# Patient Record
Sex: Female | Born: 1955 | Race: White | Hispanic: No | Smoking: Former smoker
Health system: Southern US, Community
[De-identification: ages and names within clinical notes are randomized; demographics above are authoritative.]

## PROBLEM LIST (undated history)

## (undated) DIAGNOSIS — I871 Compression of vein: Secondary | ICD-10-CM

## (undated) DIAGNOSIS — R0602 Shortness of breath: Secondary | ICD-10-CM

## (undated) DIAGNOSIS — K5090 Crohn's disease, unspecified, without complications: Secondary | ICD-10-CM

## (undated) HISTORY — PX: COLON SURGERY: SHX602

## (undated) HISTORY — PX: ANGIOPLASTY: SHX39

---

## 2001-05-04 ENCOUNTER — Encounter (HOSPITAL_COMMUNITY): Admission: RE | Admit: 2001-05-04 | Discharge: 2001-06-03 | Payer: Self-pay | Admitting: Internal Medicine

## 2001-06-22 ENCOUNTER — Encounter (HOSPITAL_COMMUNITY): Admission: RE | Admit: 2001-06-22 | Discharge: 2001-07-22 | Payer: Self-pay | Admitting: Internal Medicine

## 2001-08-20 ENCOUNTER — Other Ambulatory Visit: Admission: RE | Admit: 2001-08-20 | Discharge: 2001-08-20 | Payer: Self-pay | Admitting: Dermatology

## 2003-02-04 ENCOUNTER — Encounter (HOSPITAL_COMMUNITY): Admission: RE | Admit: 2003-02-04 | Discharge: 2003-03-06 | Payer: Self-pay | Admitting: Internal Medicine

## 2003-04-21 ENCOUNTER — Ambulatory Visit (HOSPITAL_COMMUNITY): Admission: RE | Admit: 2003-04-21 | Discharge: 2003-04-21 | Payer: Self-pay | Admitting: Internal Medicine

## 2003-04-26 ENCOUNTER — Encounter (HOSPITAL_COMMUNITY): Admission: RE | Admit: 2003-04-26 | Discharge: 2003-05-26 | Payer: Self-pay | Admitting: Internal Medicine

## 2003-06-15 ENCOUNTER — Encounter (HOSPITAL_COMMUNITY): Admission: RE | Admit: 2003-06-15 | Discharge: 2003-07-15 | Payer: Self-pay | Admitting: Internal Medicine

## 2003-09-13 ENCOUNTER — Encounter (HOSPITAL_COMMUNITY): Admission: RE | Admit: 2003-09-13 | Discharge: 2003-09-22 | Payer: Self-pay | Admitting: Internal Medicine

## 2003-09-23 ENCOUNTER — Encounter: Payer: Self-pay | Admitting: Internal Medicine

## 2003-09-23 ENCOUNTER — Ambulatory Visit (HOSPITAL_COMMUNITY): Admission: RE | Admit: 2003-09-23 | Discharge: 2003-09-23 | Payer: Self-pay | Admitting: Internal Medicine

## 2004-01-04 ENCOUNTER — Encounter (HOSPITAL_COMMUNITY): Admission: RE | Admit: 2004-01-04 | Discharge: 2004-02-03 | Payer: Self-pay | Admitting: Internal Medicine

## 2004-01-31 ENCOUNTER — Ambulatory Visit (HOSPITAL_COMMUNITY): Admission: RE | Admit: 2004-01-31 | Discharge: 2004-01-31 | Payer: Self-pay | Admitting: Internal Medicine

## 2004-02-11 ENCOUNTER — Ambulatory Visit: Admission: RE | Admit: 2004-02-11 | Discharge: 2004-02-11 | Payer: Self-pay | Admitting: Internal Medicine

## 2004-02-13 ENCOUNTER — Ambulatory Visit (HOSPITAL_COMMUNITY): Admission: RE | Admit: 2004-02-13 | Discharge: 2004-02-13 | Payer: Self-pay | Admitting: Internal Medicine

## 2004-06-28 ENCOUNTER — Ambulatory Visit (HOSPITAL_COMMUNITY): Admission: RE | Admit: 2004-06-28 | Discharge: 2004-06-28 | Payer: Self-pay | Admitting: Internal Medicine

## 2005-02-03 ENCOUNTER — Inpatient Hospital Stay (HOSPITAL_COMMUNITY): Admission: EM | Admit: 2005-02-03 | Discharge: 2005-02-05 | Payer: Self-pay | Admitting: *Deleted

## 2005-04-18 ENCOUNTER — Ambulatory Visit (HOSPITAL_COMMUNITY): Admission: RE | Admit: 2005-04-18 | Discharge: 2005-04-18 | Payer: Self-pay | Admitting: Internal Medicine

## 2006-05-01 ENCOUNTER — Ambulatory Visit (HOSPITAL_COMMUNITY): Admission: RE | Admit: 2006-05-01 | Discharge: 2006-05-01 | Payer: Self-pay | Admitting: Internal Medicine

## 2006-07-07 ENCOUNTER — Ambulatory Visit (HOSPITAL_COMMUNITY): Admission: RE | Admit: 2006-07-07 | Discharge: 2006-07-07 | Payer: Self-pay | Admitting: Internal Medicine

## 2007-03-24 ENCOUNTER — Ambulatory Visit (HOSPITAL_COMMUNITY): Admission: RE | Admit: 2007-03-24 | Discharge: 2007-03-24 | Payer: Self-pay | Admitting: Internal Medicine

## 2007-04-16 ENCOUNTER — Ambulatory Visit: Payer: Self-pay | Admitting: Internal Medicine

## 2007-05-29 ENCOUNTER — Ambulatory Visit: Payer: Self-pay | Admitting: Internal Medicine

## 2007-05-29 ENCOUNTER — Ambulatory Visit (HOSPITAL_COMMUNITY): Admission: RE | Admit: 2007-05-29 | Discharge: 2007-05-29 | Payer: Self-pay | Admitting: Internal Medicine

## 2007-05-29 ENCOUNTER — Encounter (INDEPENDENT_AMBULATORY_CARE_PROVIDER_SITE_OTHER): Payer: Self-pay | Admitting: Internal Medicine

## 2007-07-07 ENCOUNTER — Ambulatory Visit (HOSPITAL_COMMUNITY): Admission: RE | Admit: 2007-07-07 | Discharge: 2007-07-07 | Payer: Self-pay | Admitting: Internal Medicine

## 2007-08-18 ENCOUNTER — Emergency Department (HOSPITAL_COMMUNITY): Admission: EM | Admit: 2007-08-18 | Discharge: 2007-08-18 | Payer: Self-pay | Admitting: Emergency Medicine

## 2007-09-02 ENCOUNTER — Emergency Department (HOSPITAL_COMMUNITY): Admission: EM | Admit: 2007-09-02 | Discharge: 2007-09-02 | Payer: Self-pay | Admitting: Emergency Medicine

## 2007-09-04 ENCOUNTER — Other Ambulatory Visit: Admission: RE | Admit: 2007-09-04 | Discharge: 2007-09-04 | Payer: Self-pay | Admitting: Obstetrics and Gynecology

## 2007-09-04 ENCOUNTER — Ambulatory Visit (HOSPITAL_COMMUNITY): Admission: RE | Admit: 2007-09-04 | Discharge: 2007-09-04 | Payer: Self-pay | Admitting: Obstetrics and Gynecology

## 2007-09-04 ENCOUNTER — Inpatient Hospital Stay (HOSPITAL_COMMUNITY): Admission: AD | Admit: 2007-09-04 | Discharge: 2007-09-08 | Payer: Self-pay | Admitting: General Surgery

## 2007-09-05 ENCOUNTER — Encounter: Payer: Self-pay | Admitting: Diagnostic Radiology

## 2007-09-05 ENCOUNTER — Encounter (INDEPENDENT_AMBULATORY_CARE_PROVIDER_SITE_OTHER): Payer: Self-pay | Admitting: Diagnostic Radiology

## 2007-09-07 ENCOUNTER — Ambulatory Visit: Payer: Self-pay | Admitting: Internal Medicine

## 2007-09-08 ENCOUNTER — Ambulatory Visit: Payer: Self-pay | Admitting: Internal Medicine

## 2007-09-24 ENCOUNTER — Ambulatory Visit (HOSPITAL_COMMUNITY): Admission: RE | Admit: 2007-09-24 | Discharge: 2007-09-24 | Payer: Self-pay | Admitting: Internal Medicine

## 2007-10-01 ENCOUNTER — Ambulatory Visit (HOSPITAL_COMMUNITY): Admission: RE | Admit: 2007-10-01 | Discharge: 2007-10-01 | Payer: Self-pay | Admitting: Surgery

## 2007-10-02 ENCOUNTER — Ambulatory Visit (HOSPITAL_COMMUNITY): Admission: RE | Admit: 2007-10-02 | Discharge: 2007-10-02 | Payer: Self-pay | Admitting: Surgery

## 2007-10-06 ENCOUNTER — Ambulatory Visit (HOSPITAL_COMMUNITY): Admission: RE | Admit: 2007-10-06 | Discharge: 2007-10-06 | Payer: Self-pay | Admitting: Diagnostic Radiology

## 2007-10-31 ENCOUNTER — Inpatient Hospital Stay (HOSPITAL_COMMUNITY): Admission: EM | Admit: 2007-10-31 | Discharge: 2007-11-01 | Payer: Self-pay | Admitting: Emergency Medicine

## 2007-10-31 ENCOUNTER — Ambulatory Visit: Payer: Self-pay | Admitting: Internal Medicine

## 2008-02-13 IMAGING — RF DG BE SINGLE CONTRAST
12 of 24 series · 12 of 24 positions shown · non-contrast
Comparison: none

HISTORY: Crohn's disease, recurrent pelvic abscess status post percutaneous
drainage, question fistula from sigmoid colon

[Series 2: run · 1 of 1 slices shown (1 of 12)]
[im 1/1]
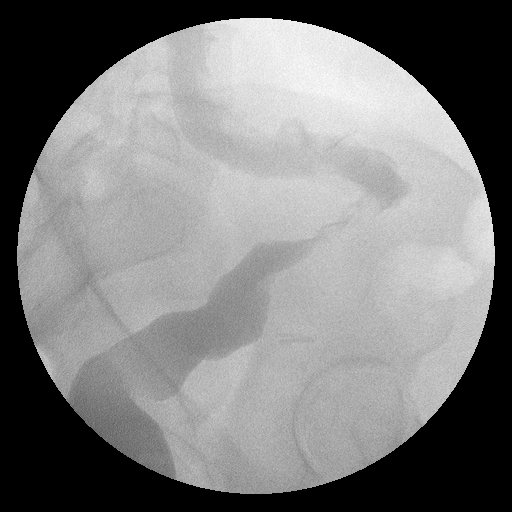

[Series 4: run · 1 of 1 slices shown (2 of 12)]
[im 1/1]
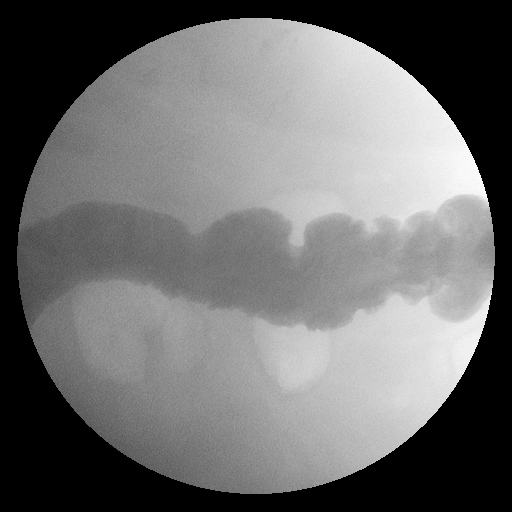

[Series 6: run · 1 of 1 slices shown (3 of 12)]
[im 1/1]
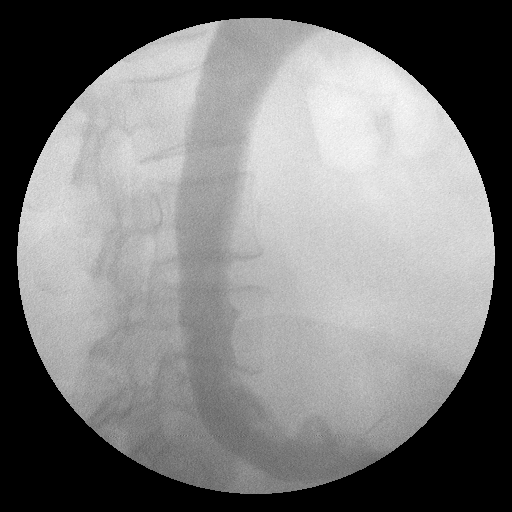

[Series 8: run · 1 of 1 slices shown (4 of 12)]
[im 1/1]
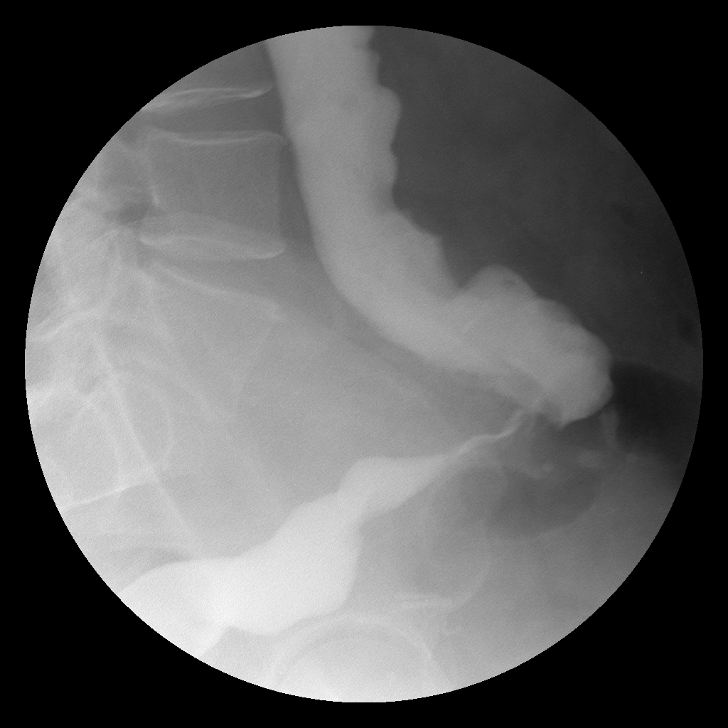

[Series 10: run · 1 of 1 slices shown (5 of 12)]
[im 1/1]
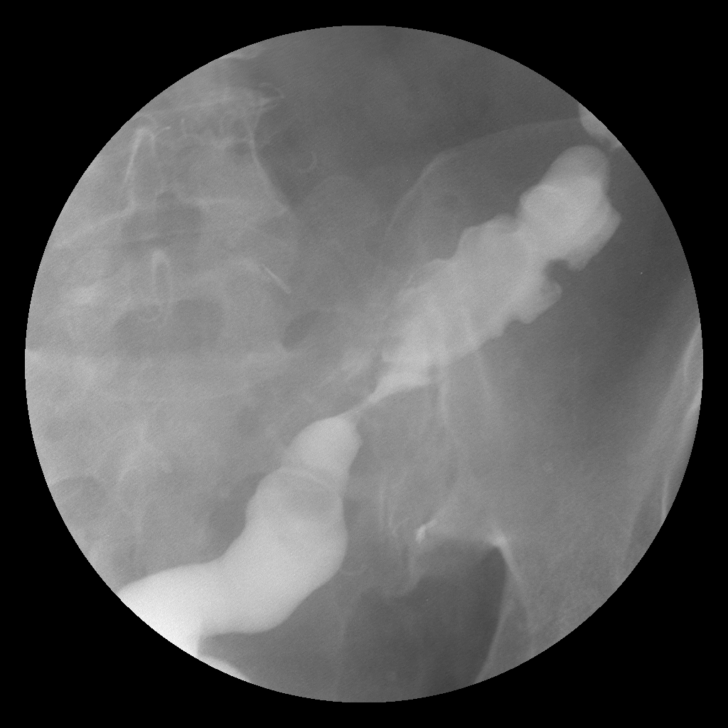

[Series 12: run · 1 of 1 slices shown (6 of 12)]
[im 1/1]
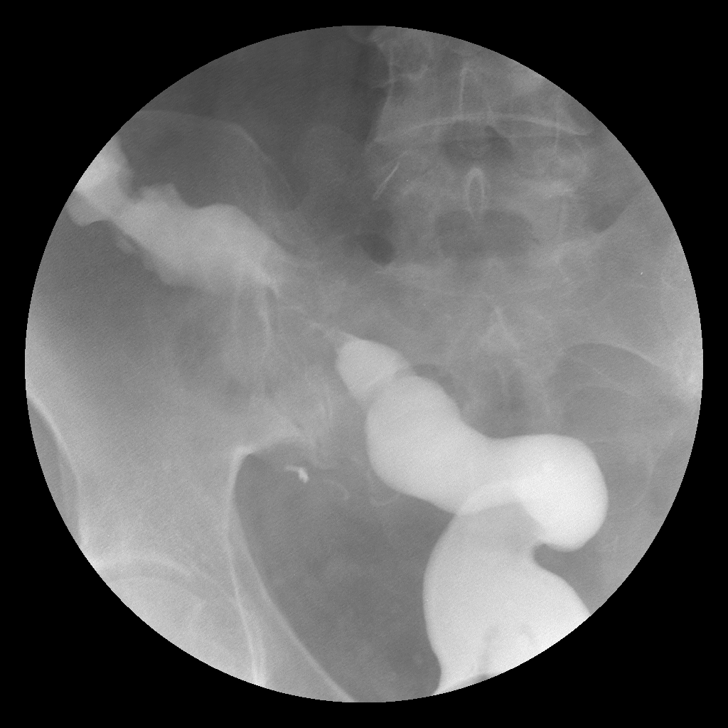

[Series 14: run · 1 of 1 slices shown (7 of 12)]
[im 1/1]
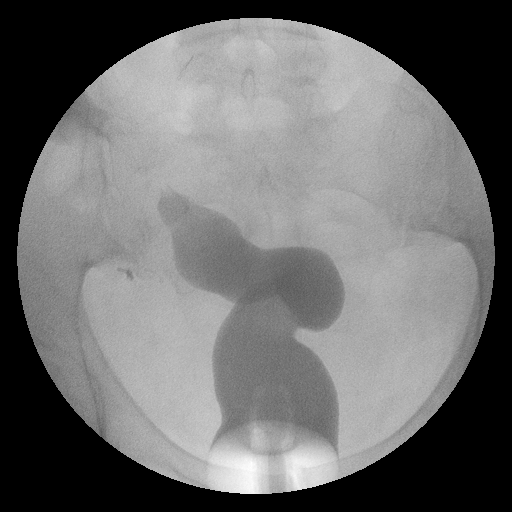

[Series 16: run · 1 of 1 slices shown (8 of 12)]
[im 1/1]
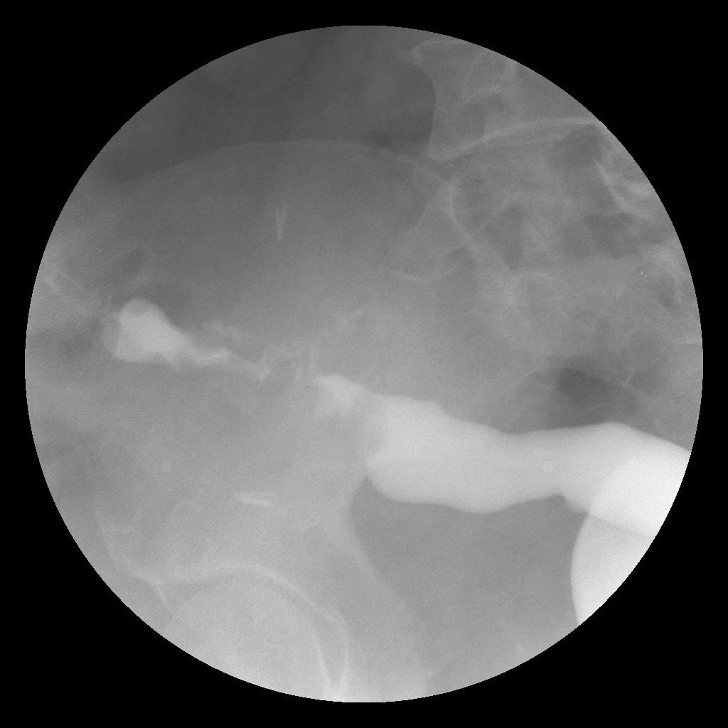

[Series 18: run · 1 of 1 slices shown (9 of 12)]
[im 1/1]
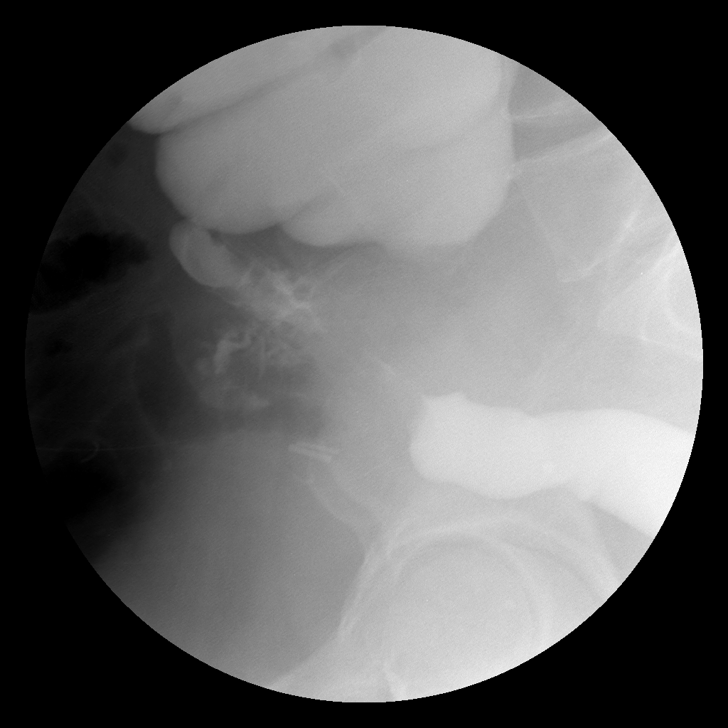

[Series 20: run · 1 of 1 slices shown (10 of 12)]
[im 1/1]
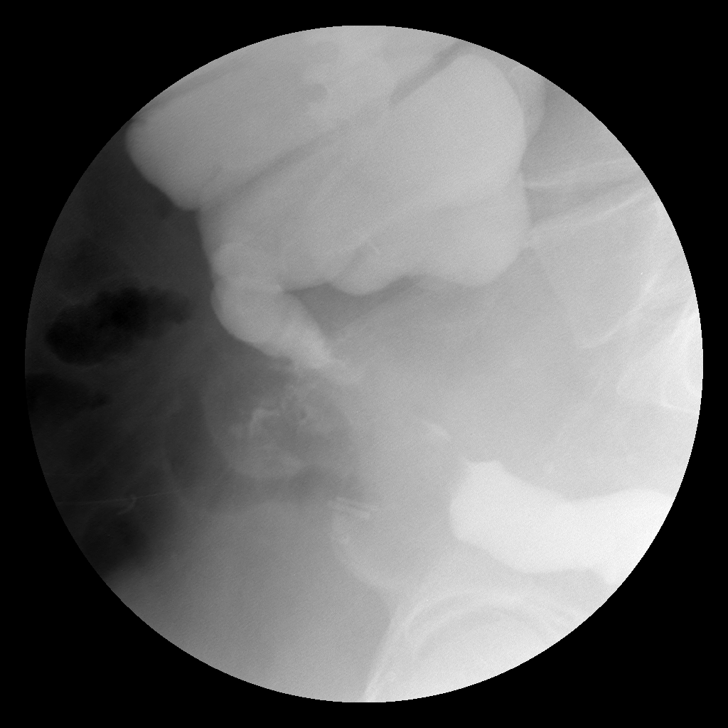

[Series 22: run · 1 of 1 slices shown (11 of 12)]
[im 1/1]
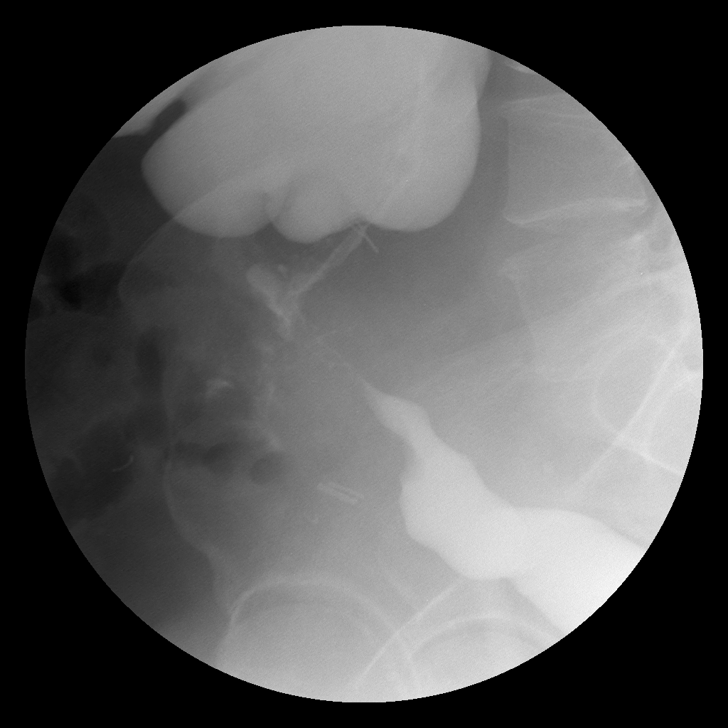

[Series 24: run · 1 of 1 slices shown (12 of 12)]
[im 1/1]
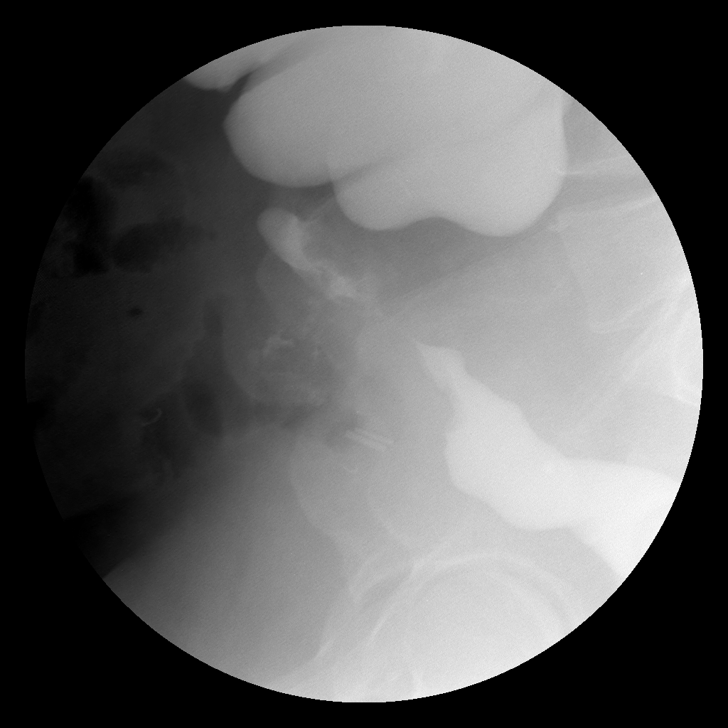

[12 of 24 positions shown; findings below may reference images not displayed]

WATER-SOLUBLE CONTRAST ENEMA:

Normal bowel gas pattern on scout image.
Water soluble contrast enema performed following instillation of dilute
[id number]-[id number].

Rectum and distal sigmoid colon distend normally.
Wall thickening and narrowing of proximal sigmoid colon identified.
During imaging, contrast extravasation from sigmoid colon is seen, thin linear
collections both superior and inferior to the area of wall thickening and
stricture, compatible with fistulae.
No large collection is opacified during imaging.
The contrast within the fistulae appears to dilute and may be dilutional within
the adjacent fluid collection seen on prior CT of 09/24/2007.
The very proximal sigmoid colon and majority of descending colon demonstrate
mucosal irregularity and thickening compatible with involvement by Crohn's
disease.
Very proximal descending colon retrograde to cecal tip demonstrates normal
mucosal fold and haustra patterns.
No additional areas of mass, stricture, wall thickening, or extravasation.
The area of contrast extravasation is visualized multiple times with gravity
pressure.
IMPRESSION: Bowel wall thickening of the descending through mid sigmoid colon most severe at
the mid sigmoid and proximal sigmoid colon where marked area of narrowing and
wall thickening is identified, compatible with given history of Crohn's disease.
Thin linear contrast extravasation is seen at these segments of significant
Crohn's involvement, compatible with fistulae, at the expected position of the
abscess collection seen on preceding CT of to 1885.
No evidence of mass, persistent intraluminal filling defect, or additional
stricture/fistula.
Findings discussed with patient and with Dr. Juliet.

## 2008-05-04 ENCOUNTER — Ambulatory Visit (HOSPITAL_COMMUNITY): Admission: RE | Admit: 2008-05-04 | Discharge: 2008-05-04 | Payer: Self-pay | Admitting: Internal Medicine

## 2009-01-05 ENCOUNTER — Ambulatory Visit (HOSPITAL_COMMUNITY): Admission: RE | Admit: 2009-01-05 | Discharge: 2009-01-05 | Payer: Self-pay | Admitting: Internal Medicine

## 2009-04-07 ENCOUNTER — Ambulatory Visit: Payer: Self-pay | Admitting: Cardiology

## 2010-05-10 ENCOUNTER — Ambulatory Visit: Payer: Self-pay | Admitting: Vascular Surgery

## 2010-05-18 ENCOUNTER — Ambulatory Visit (HOSPITAL_COMMUNITY): Admission: RE | Admit: 2010-05-18 | Discharge: 2010-05-18 | Payer: Self-pay | Admitting: General Surgery

## 2010-06-21 ENCOUNTER — Ambulatory Visit (HOSPITAL_COMMUNITY): Admission: RE | Admit: 2010-06-21 | Discharge: 2010-06-21 | Payer: Self-pay | Admitting: Vascular Surgery

## 2010-06-28 ENCOUNTER — Ambulatory Visit (HOSPITAL_COMMUNITY): Admission: RE | Admit: 2010-06-28 | Discharge: 2010-06-28 | Payer: Self-pay | Admitting: Interventional Radiology

## 2010-11-13 ENCOUNTER — Encounter: Admission: RE | Admit: 2010-11-13 | Discharge: 2010-11-13 | Payer: Self-pay | Admitting: Interventional Radiology

## 2010-12-10 ENCOUNTER — Ambulatory Visit: Payer: Self-pay | Admitting: Internal Medicine

## 2010-12-31 ENCOUNTER — Ambulatory Visit
Admission: RE | Admit: 2010-12-31 | Discharge: 2010-12-31 | Payer: Self-pay | Source: Home / Self Care | Attending: Internal Medicine | Admitting: Internal Medicine

## 2011-01-13 ENCOUNTER — Encounter: Payer: Self-pay | Admitting: Interventional Radiology

## 2011-01-13 ENCOUNTER — Encounter: Payer: Self-pay | Admitting: Internal Medicine

## 2011-01-13 ENCOUNTER — Encounter (INDEPENDENT_AMBULATORY_CARE_PROVIDER_SITE_OTHER): Payer: Self-pay | Admitting: Internal Medicine

## 2011-01-18 ENCOUNTER — Encounter
Admission: RE | Admit: 2011-01-18 | Discharge: 2011-01-18 | Payer: Self-pay | Source: Home / Self Care | Attending: Neurosurgery | Admitting: Neurosurgery

## 2011-01-22 ENCOUNTER — Other Ambulatory Visit: Payer: Self-pay | Admitting: Neurosurgery

## 2011-01-22 DIAGNOSIS — I6200 Nontraumatic subdural hemorrhage, unspecified: Secondary | ICD-10-CM

## 2011-02-01 ENCOUNTER — Ambulatory Visit
Admission: RE | Admit: 2011-02-01 | Discharge: 2011-02-01 | Disposition: A | Discharge status: Home / Self Care | Payer: Self-pay | Source: Ambulatory Visit | Attending: Neurosurgery | Admitting: Neurosurgery

## 2011-02-01 DIAGNOSIS — I6200 Nontraumatic subdural hemorrhage, unspecified: Secondary | ICD-10-CM

## 2011-02-17 ENCOUNTER — Emergency Department (HOSPITAL_COMMUNITY)
Admission: EM | Admit: 2011-02-17 | Discharge: 2011-02-17 | Disposition: A | Discharge status: Home / Self Care | Payer: Medicare Other | Attending: Emergency Medicine | Admitting: Emergency Medicine

## 2011-02-17 DIAGNOSIS — Y849 Medical procedure, unspecified as the cause of abnormal reaction of the patient, or of later complication, without mention of misadventure at the time of the procedure: Secondary | ICD-10-CM | POA: Insufficient documentation

## 2011-02-17 DIAGNOSIS — Z933 Colostomy status: Secondary | ICD-10-CM | POA: Insufficient documentation

## 2011-02-17 DIAGNOSIS — IMO0002 Reserved for concepts with insufficient information to code with codable children: Secondary | ICD-10-CM | POA: Insufficient documentation

## 2011-03-06 ENCOUNTER — Other Ambulatory Visit (HOSPITAL_COMMUNITY): Payer: Self-pay | Admitting: Interventional Radiology

## 2011-03-06 DIAGNOSIS — I871 Compression of vein: Secondary | ICD-10-CM

## 2011-03-10 LAB — BASIC METABOLIC PANEL
CO2: 24 mEq/L (ref 19–32)
Chloride: 110 mEq/L (ref 96–112)
Creatinine, Ser: 1.35 mg/dL — ABNORMAL HIGH (ref 0.4–1.2)
GFR calc non Af Amer: 41 mL/min — ABNORMAL LOW (ref >60)
Glucose, Bld: 100 mg/dL — ABNORMAL HIGH (ref 70–99)
Potassium: 4.3 mEq/L (ref 3.5–5.1)
Sodium: 140 mEq/L (ref 135–145)

## 2011-03-10 LAB — CBC
HCT: 33.2 % — ABNORMAL LOW (ref 36.0–46.0)
RBC: 3.69 MIL/uL — ABNORMAL LOW (ref 3.87–5.11)

## 2011-03-10 LAB — PROTIME-INR
INR: 1.41 (ref 0.00–1.49)
Prothrombin Time: 17.1 seconds — ABNORMAL HIGH (ref 11.6–15.2)

## 2011-03-21 ENCOUNTER — Other Ambulatory Visit (HOSPITAL_COMMUNITY): Payer: Self-pay | Admitting: Interventional Radiology

## 2011-03-21 ENCOUNTER — Other Ambulatory Visit: Payer: Self-pay | Admitting: Interventional Radiology

## 2011-03-21 ENCOUNTER — Ambulatory Visit (HOSPITAL_COMMUNITY)
Admission: RE | Admit: 2011-03-21 | Discharge: 2011-03-21 | Disposition: A | Discharge status: Home / Self Care | Payer: Medicare Other | Source: Ambulatory Visit | Attending: Interventional Radiology | Admitting: Interventional Radiology

## 2011-03-21 DIAGNOSIS — R609 Edema, unspecified: Secondary | ICD-10-CM | POA: Insufficient documentation

## 2011-03-21 DIAGNOSIS — I871 Compression of vein: Secondary | ICD-10-CM

## 2011-03-21 DIAGNOSIS — Z95820 Peripheral vascular angioplasty status with implants and grafts: Secondary | ICD-10-CM

## 2011-03-21 DIAGNOSIS — R51 Headache: Secondary | ICD-10-CM | POA: Insufficient documentation

## 2011-03-21 LAB — BASIC METABOLIC PANEL
BUN: 25 mg/dL — ABNORMAL HIGH (ref 6–23)
Calcium: 9 mg/dL (ref 8.4–10.5)
GFR calc non Af Amer: 49 mL/min — ABNORMAL LOW (ref >60)
Glucose, Bld: 109 mg/dL — ABNORMAL HIGH (ref 70–99)
Sodium: 140 mEq/L (ref 135–145)

## 2011-03-21 LAB — APTT: aPTT: 27 seconds (ref 24–37)

## 2011-03-21 LAB — CBC
Hemoglobin: 11.8 g/dL — ABNORMAL LOW (ref 12.0–15.0)
MCHC: 32.9 g/dL (ref 30.0–36.0)
MCV: 90 fL (ref 78.0–100.0)
Platelets: 255 10*3/uL (ref 150–400)
RDW: 14 % (ref 11.5–15.5)

## 2011-03-21 LAB — PROTIME-INR
INR: 0.93 (ref 0.00–1.49)
Prothrombin Time: 12.7 seconds (ref 11.6–15.2)

## 2011-03-21 MED ORDER — IOHEXOL 300 MG/ML  SOLN
100.0000 mL | Freq: Once | INTRAMUSCULAR | Status: AC | PRN
  Administered 2011-03-21: 50 mL via INTRAVENOUS

## 2011-04-16 ENCOUNTER — Other Ambulatory Visit: Payer: Medicare Other

## 2011-04-30 ENCOUNTER — Other Ambulatory Visit: Payer: Medicare Other

## 2011-05-07 NOTE — Consult Note (Signed)
Kiara Welch, Kiara Welch                 ACCOUNT NO.:  [id number]   MEDICAL RECORD NO.:  [id number]          PATIENT TYPE:  INP   LOCATION:  A309                          FACILITY:  [facility]   PHYSICIAN:  Chelsea Primus, MD      DATE OF BIRTH:  03/10/1956   DATE OF CONSULTATION:  10/31/2007  DATE OF DISCHARGE:                                 CONSULTATION   REASON FOR CONSULTATION:  Abdominal pain.   HISTORY OF PRESENT ILLNESS:  The patient is a 55 year old female with a  history of Crohn's disease, arthritis, and previously diagnosed intra-  abdominal abscess associated with her Crohn's disease.  She currently  presents with a 24-hour history of worsening abdominal pain, nausea,  vomiting which started acutely.  She describes her pain as constant  within the lower abdomen.  Denies any radiation.  She describes it as  cramping in nature.  She has had similar episodes of this in the past  associated with her Crohn's disease.  She denies any current fever or  chills.  She had a previous interventional radiology placed a pigtail  catheter into the intra-abdominal abscess.  She states that her drainage  has continued.  It has not changed significantly over the last several  days, although she states she did not empty her pouch within the last 24  hours.  She has had continued bowel function and her last bowel movement  was noted to be watery in nature.   PAST MEDICAL HISTORY:  Positive for:  1. Crohn's.  2. Osteoarthritis.  3. Prolapse uterus.   PAST SURGICAL HISTORY:  1. Oophorectomy.  2. Colon resection.  3. Interventional radiology drainage of intra-abdominal abscess.   CURRENT MEDICATIONS:  None.   ALLERGIES:  No known drug allergies.   SOCIAL HISTORY:  She is a positive tobacco smoker.  No alcohol use.  No  recreational drug use.   PHYSICAL EXAMINATION:  VITAL SIGNS:  98.7, heart rate 82, respiratory  rate 16, BP 112/69.  GENERAL:  Currently she is not in any acute distress.  She  is alert and  oriented.  She had a recently placed NG tube in place which was draining  gastric secretions/gastric contents.  No frank bile is noted.  HEENT:  Pupils are equal, round, and reactive.  Extraocular movements  intact.  No conjunctival pallor or scleral icterus is noted.  PULMONARY:  Regular respirations.  She is clear to auscultation.  HEART:  Rate is regular rate and rhythm.  ABDOMEN:  She does have positive bowel sounds.  Her abdomen is soft.  Mildly distended.  She has a mild amount of tenderness, particularly in  the infraumbilical region.  No masses.  No hernias.  No peritoneal signs  are apparent.  Drain site shows no erythema or drainage around the  drain.  The dressing is dry.  Percutaneous drainage is purulent in  appearance.  EXTREMITIES:  Warm and dry.   PERTINENT LABORATORY AND RADIOGRAPHIC EVALUATION:  CBC:  White blood  cell count of 7.9, hemoglobin 13.4, hematocrit 30.4, platelets 378.  Chem-7:  Sodium 142,  potassium 4, chloride 104, bicarb 24, BUN 8,  creatinine 0.7, glucose 118.  CT evaluation of the abdomen and pelvis  demonstrated no free air.  There is a pigtail catheter is present.  There are several loops of  surrounding the small bowel.  There is some  thickening of the surrounding tissue, consistent with acute  inflammation.  No significant intra-abdominal abscess is apparent.   ASSESSMENT/PLAN:  Crohn's disease.  At this point I suspect exacerbation  of her Crohn's disease.  She has not been on any medications.  She is to  start Remicade per her primary gastroenterologist who does not come to  [facility].  At this point, she has not been on any previous  treatment for her Crohn's disease.   Based on her symptoms, I recommend:  1. Continued bowel rest.  2. I agree with the NG compression  3. Continued IV fluid hydration.  4. I additionally, recommend at this point continuing empiric      antibiotic coverage secondarily to her previously  placed      percutaneous drain for her previously diagnosed fistula, although      this may be contributing.  Continued conservative management is      indicated at this point.  No acute surgical intervention apparent      and would recommend continued conservative management of the      fistula with hopes to avoid operation in a patient with known      Crohn's which was discussed at length with the patient and      patient's family.  5. We will continue those directions as planned.  6. We will continue to follow the patient while she hospitalized at      [facility].  We will monitor her progression.   I appreciate the opportunity to participate in her care.      Chelsea Primus, MD  Electronically Signed     BZ/MEDQ  D:  10/31/2007  T:  11/01/2007  Job:  [id number]   cc:   Estill Bamberg. Karie Kirks, M.D.  Fax: [phone]

## 2011-05-07 NOTE — Discharge Summary (Signed)
NAME:  Kiara Welch, Kiara Welch NO.:  [id number]   MEDICAL RECORD NO.:  [id number]          PATIENT TYPE:  INP   LOCATION:  P947                          FACILITY:  [facility]   PHYSICIAN:  Jamesetta So, M.D.  DATE OF BIRTH:  1956-08-29   DATE OF ADMISSION:  09/04/2007  DATE OF DISCHARGE:  09/16/2008LH                               Bertie COURSE SUMMARY:  The patient is a 55 year old white female with  known history of Crohn's disease, who presented with a fluid collection  around the sigmoid colon.  A white count was also noted be elevated to  19,000.  The following day, she underwent a CT-guided percutaneous  drainage of the fluid collection.  Interestingly, the fluid was  relatively clear, though final cultures revealed an E-coli which was  resistant to ciprofloxacin.  The patient's white blood cell count  started to normalize, once her steroid dose was decreased.  Cell  cytology on the fluid is still pending.   The drainage on the day of discharge had significantly decreased, thus  the pigtail drain was removed.  The patient is being discharged home on  September 08, 2007 in good improving condition.   DISCHARGE INSTRUCTIONS:  The patient is to follow up with Dr. Laural Golden, as  previously as scheduled.   DISCHARGE MEDICATIONS:  Include:  1. Keflex 500 mg p.o. t.i.d. times 1 week.  2. Flagyl 200 mg p.o. t.i.d. times 1 week.  3. Percocet as previously prescribed.  4. Prednisone 30 mg p.o. daily   PRINCIPAL DIAGNOSES:  1. Intra-abdominal abscess/fluid collection.  2. History of Crohn's disease.   PRINCIPAL PROCEDURE:  A CT-guided percutaneous drainage of intra-  abdominal fluid collection on September 05, 2007.      Jamesetta So, M.D.  Electronically Signed     MAJ/MEDQ  D:  09/08/2007  T:  09/08/2007  Job:  [id number]   cc:   Jamesetta So, M.D.  Fax: [phone]   Patient Chart   Hildred Laser, M.D.  Fax: [phone]

## 2011-05-07 NOTE — H&P (Signed)
NAME:  Kiara Welch, Kiara Welch                 ACCOUNT NO.:  [id number]   MEDICAL RECORD NO.:  [id number]          PATIENT TYPE:  INP   LOCATION:  A331                          FACILITY:  [facility]   PHYSICIAN:  Jamesetta So, M.D.  DATE OF BIRTH:  10-20-1956   DATE OF ADMISSION:  09/05/2007  DATE OF DISCHARGE:  LH                              HISTORY & PHYSICAL   AGE:  55 years old.   CHIEF COMPLAINT:  Intraabdominal abscess.   HISTORY OF PRESENT ILLNESS:  Patient is a 55 year old white female with  a history of Crohn disease who underwent an ultrasound of her pelvis by  Dr. Mallory Shirk for pelvic pain and was found to have a large fluid  collection.  This was felt to be an abscess in nature.  She is being  admitted to [facility] for further workup and treatment.   PAST MEDICAL HISTORY:  Includes Crohn disease.   PAST SURGICAL HISTORY:  Partial bowel resection, incisional  herniorrhaphy.   CURRENT MEDICATIONS:  Prednisone 10 mg p.o. daily.   ALLERGIES:  No known drug allergies.   REVIEW OF SYSTEMS:  Patient does smoke.  She denies any alcohol use.  She denies any other cardiopulmonary difficulties or bleeding disorders.   PHYSICAL EXAMINATION:  Patient is a well-developed, well-nourished white  female in no acute distress.  LUNGS:  Clear to auscultation with equal breath sounds bilaterally.  HEART EXAMINATION:  Reveals a regular rate and rhythm without S3, S4, or  murmurs.  ABDOMEN:  Soft with fullness and tenderness in the suprapubic and left  lower quadrant regions.  No hepatosplenomegaly, masses, or hernias  identified.  RECTAL EXAMINATION:  Revealed fullness in the rectum.   White blood cell count 19.8, hematocrit 36, platelet count 442, MET7 is  remarkable for a potassium of 3.4.  BUN and creatinine are within normal  limits.  Lipase is within normal limits.   CT scan of the abdomen and pelvis reveals a large ascending pelvic  abscess consistent with a possible  perforated diverticulitis.   IMPRESSION:  Intraabdominal abscess, question flare up of Crohn disease  versus diverticulitis.   PLAN:  The patient will be admitted to hospital for Ciprofloxacin and  Flagyl IV.  She subsequently is being transported to [facility] for  interventional radiology.  She will undergo a percutaneous CT guided  drainage of the abscess.  The risks and benefits of the procedure were  fully explained to the patient, who gives informed consent.      Jamesetta So, M.D.  Electronically Signed     MAJ/MEDQ  D:  09/05/2007  T:  09/05/2007  Job:  [id number]   cc:   Hildred Laser, M.D.  Fax: [phone]   Tesfaye D. Legrand Rams, MD  Fax: [phone]   Jonnie Kind, M.D.  Fax: [phone]

## 2011-05-07 NOTE — Group Therapy Note (Signed)
NAMEGICELA, SCHWARTING                 ACCOUNT NO.:  [id number]   MEDICAL RECORD NO.:  [id number]          PATIENT TYPE:  INP   LOCATION:  A309                          FACILITY:  [facility]   PHYSICIAN:  Estill Bamberg. Karie Kirks, M.D.DATE OF BIRTH:  01/11/1956   DATE OF PROCEDURE:  DATE OF DISCHARGE:  11/01/2007                                 PROGRESS NOTE   SUBJECTIVE:  She is feeling more distended today.   OBJECTIVE:  VITAL SIGNS:  Temperature temperature is 98.6, pulse 76,  respiratory rate 20, blood pressure 137/81.  GENERAL:  She is sitting in bed.  She is well-developed, well-nourished,  in distress due to the abdominal distention and lower abdominal pain.  She appears oriented, alert.  LUNGS:  The lungs are clear throughout moving air well, no intercostal  retractions, no use of accessory muscle respiration.  HEART:  Irregular  rhythm, rate of 80.  ABDOMEN:  The abdomen is distended and slightly tender in the lower  abdomen.  There is trace edema of the ankles.   White cell count today is 7,900 with 64 neutrophils, 24 lymphs, H&H  13.4/40.4.  White cell count is down from 14,900 last night, 88  neutrophils, 9 lymphs.   ASSESSMENT:  1. Crohn disease.  2. Fistula.   PLAN:  Continue the Rocephin 1 gram IV q.24 h.  Add metronidazole 500 mg  IV q.8 h.  Consult with GI and surgery.  Will discuss with Dr. Laural Golden,  who has been her gastroenterologist in the past.  Discussed all of this  with the patient and her mom, who is in the room with her.      Estill Bamberg. Karie Kirks, M.D.  Electronically Signed     SDK/MEDQ  D:  10/31/2007  T:  11/01/2007  Job:  [id number]

## 2011-05-07 NOTE — Consult Note (Signed)
Kiara Welch, Kiara Welch                 ACCOUNT NO.:  [id number]   MEDICAL RECORD NO.:  [id number]          PATIENT TYPE:  INP   LOCATION:  A309                          FACILITY:  [facility]   PHYSICIAN:  R. Garfield Cornea, M.D. DATE OF BIRTH:  10-Aug-1956   DATE OF CONSULTATION:  10/31/2007  DATE OF DISCHARGE:                                 CONSULTATION   GASTROENTEROLOGY CONSULTATION   REASON FOR CONSULTATION:  Abdominal pain, nausea and vomiting in patient  with Crohn's colitis.   REFERRING PHYSICIAN:  Estill Bamberg. Karie Kirks, M.D.   HISTORY OF PRESENT ILLNESS:  Ms. Kiara Welch is a pleasant 55-year-  old Caucasian female [place] [employer] with a good 25 year history  of Crohn's colitis admitted through the emergency department on October 30, 2007 with worsening abdominal pain, nausea and vomiting.  She was  admitted to Dr. Vickey Sages service.   She was recently admitted here in September with a large dumb-bell  shaped intra-abdominal abscess requiring percutaneous drainage under the  care of Dr. Arnoldo Morale.  I saw her in consultation at that time.  Ultimately, the abscess fluid grew out Escherichia coli.  The drain was  pulled out in approximately 4 days and she saw Dr. Laural Golden (her primary  gastroenterologist) in followup in the latter part of September, as I  understand and there was concern that she may end up needing surgery for  fistulizing Crohn's disease.  There was some discussion about Remicade  at her followup visit as I understand and arrangements were made for her  to see Dr. Neldon Mc, general surgeon down in [place], which  she did.  She had recurrent abdominal pain and underwent a repeat CT  scan the early part of February which revealed a recurrent abscess for  which percutaneous drainage was again employed and since that time she  remains with a JP drain.  This drain has put out 20 to 30 cc of milky  [place]iz[place], malodorous fluid since it was placed.  She did  undergo a  Gastrografin enema down in [place] which suggested a fistula tract.   In the interim since last being admitted here, she saw Dr. Mallory Shirk  in regards to concerns about a prolapse of her uterus.  She tells me Dr.  Glo Herring told her that her uterus was in a fixed position, likely  related to adhesions related to her Crohn's disease.   A CT scan today reveals a significant partial small bowel obstruction  and phlegmonous changes / chronic inflammatory changes about the small  bowel and sigmoid colon in close approximation to the dome of the  uterus, just where the JP drain is located.  There was, however, no  fluid collection, but again, there was a notable partial small bowel  obstruction.   An NG tube was placed this afternoon at 13:00 and immediately recovered  400 cc of gastric contents and now there is a good liter of fluid in the  Vacutainer and Kiara Welch's abdominal pain and nausea have lessened  considerably.   Admission laboratory data includes a  white count of 7.9, H and H 13.4  and 40.4.  She has been started on antibiotics in the way of Rocephin  and Flagyl.   Kiara Welch tells me that her bowel movements have become smaller and  infrequent over the past two to three days.  In addition, where the JP  drain was sutured in, has broken down and the skin is irritated and she  has noted green drainage from around the [place] site.  She has not had any  fever or chills.  She has not been on any outpatient medicines for  Crohn's although she took Keflex for approximately two weeks after the  second JP was placed.  She previous to that had been on prednisone as an  outpatient and the corticosteroid therapy was continued with Solu-Medrol  through her hospitalization here in September and she was discharged on  prednisone, but subsequently she has stopped taking this agent.   PAST MEDICAL HISTORY:  1. Significant as relates to Crohn's colitis.  She has had this a good       25+ years.  She has had multiple surgeries for perianal fistula,      exploratory laparotomy, drainage of multiple intra-abdominal      abscesses.  She has been seen by Dr. Kathyrn Lass and had surgery at      [facility] previously.  2. She is status post left salpingo-oophorectomy for benign tumor.  3. Status post appendectomy.  4. She had surgery, lastly, in March, 2000 at [facility] for recurrent intra-abdominal abscess.  She has a history      of Port-A-Cath placement.  She received Remicade three times back      in 2000 and reportedly developed an abscess subsequent to biologic      therapy.  5. Apparently she has also undergone a right salpingo-oophorectomy      previously for benign tumor.  6. She is status post partial sigmoid colectomy in the past as well.      A colonoscopy was last done in May 29, 2007 by Dr. Laural Golden.  He      found active disease in her sigmoid and descending colon.  She had      a single diverticulum in her right colon.  7. She has had various INTOLERANCES to all MESALAMINE preparations      previously, felt her throat was closing off from [facility]      previously.  She told she generally felt terrible with a course of      6-MP previously.  She has tolerated antibiotics and corticosteroid      therapies in the past.   MEDICATIONS ON ADMISSION:  None.   CURRENT MEDICATIONS:  1. Rocephin.  2. Intravenous Flagyl.   ALLERGIES:  No known drug allergies, although INTOLERANCES to CROHN'S  AGENTS AS DESCRIBED ABOVE.   FAMILY HISTORY:  Negative for chronic GI or liver illness.   SOCIAL HISTORY:  The patient is married, has four children, one step-  child.  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  She lives here in  [place].  She smokes one pack of cigarettes about every three days.  No alcohol or illicit drugs.   REVIEW OF SYSTEMS:  Has not had  a recent fever.  She denies weight loss.  There has been no hematochezia or melena.  No  hematemesis.  She really  has not had any reflux symptoms, odynophagia or dysphagia.  No unusual  skin, eye or joint problems.  No chest pain, dyspnea and no fever or  chills.   PHYSICAL EXAMINATION:  GENERAL APPEARANCE:  Reveals a pleasant 62-year-  old lady who is alert and conversant but appears not to feel well.  VITAL SIGNS:  Temperature 98.1.  Pulse 84.  Respiratory rate 16.  Blood  pressure 111/75.  SKIN:  Warm and dry.  She has an NG tube in place draining dark bilious-  colored fluid.  HEENT:  No scleral icterus.  Conjunctivae are pink.  CHEST:  Lungs are clear to auscultation.  CARDIAC:  Heart has a regular rate and rhythm without murmurs, rubs or  gallops.  ABDOMEN:  Full.  She has multiple well-healed surgical scars.  Bowel  sounds slightly infrequent and high-pitched.  She has a JP drain in  place.  She has mild diffuse tenderness to palpation, more pronounced in  both lower quadrants.  No obvious mass or organomegaly.  EXTREMITIES:  No edema.   LABORATORY DATA:  CBC as stated above.  Sodium 142, potassium 4.0,  chloride 104, CO2 29, glucose 118.  BUN 8, creatinine 0.74, bilirubin  0.4, alkaline phosphatase 50, AST 15, ALT 14, albumin 3.1, calcium 9.2.   IMPRESSION:  Ms. Suvi Archuletta. Welch is a very pleasant 55 year old lady with  multi-decade history of Crohn's colitis admitted to the hospital with  acute on chronic abdominal pain, nausea and vomiting.   The CT scan from day, which I personally reviewed with Dr. Earle Gell,  indicates a notable partial small bowel obstruction and marked chronic  inflammatory changes in the pelvis in the area of the small bowel, dome  of the uterus and left colon.  JP drain seems to be in a good position  and there is no free fluid or free air.   This lady has symptomatically improved considerably since having NG tube  placed.   She presented with a  large abscess in September, which was drained  percutaneously.  The abscess grew out Escherichia coli.  She has had  recurrence (or more likely persistence) of intra-abdominal infection  with radiographic evidence of recurrent abscess requiring repeat  drainage, approximately one month ago, down in [place].  That fluid  also grew out Escherichia coli.  Gastrografin enema at that time  suggested fistula.   This lady's JP continues to drain milky, [place], malodorous fluid and has  not really nadired.  It is putting out a good 20 to 30 cc daily and now  the skin is draining around the JP site.  I am most concerned that she  basically has a hole in her colon somewhere and the JP drain at this  time is simply functioning as a fistula.  This has been an ongoing  process for a month and I doubt it is going to improve as is.   Moreover, this lady is now presenting with partial small bowel  obstruction, which I suspect is related to phlegmonous process in her  abdomen related to the recent abscess.  There may be significant  involvement in this phlegmonous process with her uterus as well.   RECOMMENDATIONS:  1. In my mind, this lady is not a candidate for any biologic therapy      as she has been recently on prednisone and clinically has ongoing      infection.  I suspect she will need surgical  intervention to      definitively deal with her small bowel obstruction and the ongoing      inflammatory process.  2. Kiara Welch has related to me she would like to get her uterus out if      she has surgery, and this may not at all be unreasonable as the      uterus may be quite involved in this process.  3. I suspect surgical intervention will be a challenge and may be best      contemplated and carried out at a tertiary referral center.  I      would give some consideration to sending her back to Dr. Kathyrn Lass,      who has performed surgery on her in the past over at [facility].  4. I agree with culture of the discharge drainage around the JP tube,      but I have also requested nursing staff to send some of the [place],      milky discharge COMING OUT of her JP drain for Gram stain and      culture as well.  5. I will be discussing the case with Dr. Karie Kirks in the very  near      future.   I would like to thank Dr. Karie Kirks for allowing me to see this nice lady  once again with a very challenging problem.      Bridgette Habermann, M.D.  Electronically Signed     RMR/MEDQ  D:  10/31/2007  T:  11/01/2007  Job:  [id number]   cc:   Hildred Laser, M.D.  Fax: [phone]   Estill Bamberg. Karie Kirks, M.D.  Fax: [phone]

## 2011-05-07 NOTE — Group Therapy Note (Signed)
NAMEAMARII, Kiara Welch                 ACCOUNT NO.:  [id number]   MEDICAL RECORD NO.:  [id number]          PATIENT TYPE:  INP   LOCATION:  A309                          FACILITY:  [facility]   PHYSICIAN:  Estill Bamberg. Karie Kirks, M.D.DATE OF BIRTH:  1956-09-06   DATE OF PROCEDURE:  11/01/2007  DATE OF DISCHARGE:  11/01/2007                                 PROGRESS NOTE   SUBJECTIVE:  The patient feels much better now that she has an NG tube  in.  There is occasional crampy pain.  She reminds me that around 2000-  2001, she has three separate surgeries at [facility] by Dr.  Kathyrn Lass.   OBJECTIVE:  VITAL SIGNS:  Temperature 98.7, pulse 92 respiratory rate  20, blood pressure 117/71.  GENERAL:  She is semirecumbent in bed, oriented, alert, well developed,  well nourished, in NAD.  HEENT:  She had an NG tube to suction.  HEART:  Has a regular rhythm, rate about 80.  LUNGS:  Clear throughout.  ABDOMEN:  Soft.  There is really no organomegaly or mass.  The  distention from yesterday has cleared.  EXTREMITIES:  There is no edema of the ankles.   ASSESSMENT:  1. Crohn's disease.  2. Ileus.  3. Chronically draining fistula.   PLAN:  Discussed with Dr. Gala Romney, gastroenterologist, last night.  He  recommended she be transferred to [facility] either today or tomorrow, and  will need definitive surgery for the draining sinus.  Discussed all this  with her.      Estill Bamberg. Karie Kirks, M.D.  Electronically Signed     SDK/MEDQ  D:  11/01/2007  T:  11/02/2007  Job:  [id number]

## 2011-05-07 NOTE — H&P (Signed)
Kiara Welch, Kiara Welch                 ACCOUNT NO.:  [id number]   MEDICAL RECORD NO.:  [id number]          PATIENT TYPE:  INP   LOCATION:  A309                          FACILITY:  [facility]   PHYSICIAN:  Estill Bamberg. Karie Kirks, M.D.DATE OF BIRTH:  09/24/1956   DATE OF ADMISSION:  10/30/2007  DATE OF DISCHARGE:  LH                              HISTORY & PHYSICAL   A 55 year old woman presented to emergency room with abdominal pain and  abdominal distention.   She has a long history of Crohn's disease going back at least 30 years.  She had a major flare at year 2000 at which time she had to have a bowel  resection.  She tells me that she is unable to tolerate pretty  everything that has been used for Crohn's including methotrexate.  Her  gastroenterologist, Dr. Laural Golden, has considered using is subcutaneous  methotrexate but this has not been used yet.   Further she has developed a fistula the last few months.  This was  originally diagnosed by  Glo Herring which she had pelvic pain.  She had a  fluid collection which was drained by interventional radiology.  Apparently this was a negative culture initially but then grew out E.  Coli later.   She is on no medication.  No allergies are noted.  She has been a  smoker.   Apparently this summer she had a prolapsed uterus and she manually put  this back in place and then developed the pain as mentioned above with a  workup admission initially by Dr. first and later by Dr. Aviva Signs of  surgery.   Her bowel surgery was done I think in the year 2002. It was done at  [facility].   There is also a very strong family history.  Her first cousin has  Crohn's disease.  She has a child who has irritable bowel disease  and a  sibling who has ulcerative colitis.  Further, an uncle had Crohn's  disease.   Apparently this family member from [place] and numerous members the  family had GI problems of various types including Crohn's disease.   EXAM:   Emergency room showed a pleasant middle-aged woman whose  temperature is 98.5, blood pressure 109/79, pulse 93, respiratory rate  20.  She is oriented and alert.  No acute distress but concerned of  course over the abdominal distention, lower abdominal pain.  This has  been getting significant worse over the last couple of days.  There is  no axillary significant adenopathy.  There is no anterior cervical  adenopathy.  Mucous membranes appeared to be moist, skin turgor normal.  The heart had a regular rhythm, rate of about 80, lungs appear clear  throughout moving air well without intercostal retraction, without use  accessory muscles of respiration.  The abdomen was mildly tender in the  lower abdomen.  Drain tube was noted a left lower quadrant.  Distention  is noted in the upper abdomen.  There is no edema of the ankles.   O2 sats were 95% and 97% on room air.  Pain was 7 on a scale of 10 after  treatment and 10 scale 10.4 before IV Dilaudid.   Her admission white cell count is 14,900 of which 88% were neutrophils,  nine lymphs.  H&H is 13.0, 39.4, MCV of 85, platelet count 363,000.  CMP  showed a glucose of 118, albumin 3.1.  UA stick was negative.  Blood  cultures obtained.   A CT of the abdomen and pelvis showed mild dilatation of small bowel  loops and lower abdomen and the right colon to be fluid-filled.  There  is some mild edema within the extraperitoneal tissues of the pelvic  floor and small bowel adhesions in the deep fascia of the recti sheath.  She has a small amount of free flow in the anterior and mid and right  abdomen, trace to mild extent in anterior bowel loops.  Pigtail catheter  was stable in position.  Evidence of prior abscess was noted, but there  was apparently no residual drainage of fluid within the central  peritoneal cavity.  Radiologist felt there was a degree of at least  partial small bowel obstruction related to the  inflammatory change in  the  central anatomic pelvis.   ADMISSION DIAGNOSIS:  1. Crohn's disease.  2. Small bowel obstruction.  3. Draining fistula  4. Strong family history for ulcerative  colitis and regional      enteritis.   I discussed this with the patient.  Admit to hospital, put on IV fluids,  put on IV antibiotics and if in the morning she is improved discharge  home and if there is no improvement will get GI and surgery to see her  and further workup is needed.      Estill Bamberg. Karie Kirks, M.D.  Electronically Signed     SDK/MEDQ  D:  10/31/2007  T:  11/01/2007  Job:  [id number]

## 2011-05-07 NOTE — Consult Note (Signed)
NAME:  Kiara Welch, Kiara Welch                 ACCOUNT NO.:  [id number]   MEDICAL RECORD NO.:  [id number]          PATIENT TYPE:  INP   LOCATION:  A331                          FACILITY:  [facility]   PHYSICIAN:  R. Garfield Cornea, M.D. DATE OF BIRTH:  05/10/1956   DATE OF CONSULTATION:  09/07/2007  DATE OF DISCHARGE:                                 CONSULTATION   REASON FOR CONSULTATION:  Patient with abscess.   HISTORY OF PRESENT ILLNESS:  The patient is a 55 year old Caucasian  female with a good 25 year history of well chronicled Crohn's disease  (colitis and ileitis) who has been followed over the years by Dr. Laural Golden  who reports she was doing as well as she has ever done in her life on  TSO (or pig worm larvae obtained from [place])  over the past three  months (see below).  This lady tells me that her abdominal pain, her  diarrhea, bloating and fecal incontinence pretty much resolved over the  past three months.  She tells me that last month, she developed a  recurrent uterine prolapse which has occurred on numerous occasions  before.  She manually reduced her uterus and felt something tear and  then she started having some pelvic pain thereafter.  She saw Dr.  Nira Retort on August 04, 2007, obtained a CT of the abdomen and pelvis  which demonstrated inflammatory changes of the entire sigmoid colon and  a fluid collection around the proximal aspect of the sigmoid.  Really  nothing else particularly significant on that study.  Her abdominal pain  waxed and waned.  She called Dr. Laural Golden, who is her primary  gastroenterologist, one week ago who started her empirically on  prednisone 30 mg orally daily and Cipro, which she did start.  Symptoms  got no better.  She presented to the emergency department September 04, 2007, and underwent further investigation.  A redo CT of the abdomen and  pelvis demonstrated a large dumbbell shaped fluid collection in her  pelvis consistent with an abscess.  It  appeared to be in communication  with the fluid collection seen.  On last month's study interestingly  enough, her left colon colonic wall thickening appeared relatively  improved on the latest study.   She was admitted to Dr. Arnoldo Morale' service who arranged for her to have  percutaneous drainage done in [place], [place], 180 mL of  fluid was immediately withdrawn by Dr. Jeronimo Norma.  Since that time, she has  had relatively minimal fluid output (no more than 20 mL over the past  two hours).  Gram stain showed a rare PMN, no organisms, quite a bit of  squamous epithelial cells.  Early cultures showing scant growth of E.  coli.   She has been placed on IV Solu-Medrol as well as Cipro and Flagyl and  she remains quite stable; in fact, is feeling much better than when she  presented.   PAST MEDICAL HISTORY:  As related to Crohn's disease (colitis).  She has  had the disease since she was around 23.  She has had  numerous surgeries  including repair of rectal perianal fistula, exploratory laparotomy, and  drainage of multiple intra-abdominal abscesses, status post left  oophorectomy and appendectomy.  In March 2000, she had an exploratory  laparotomy for a recurrent intra-abdominal abscess.  She has had a Port-  A-Cath before.  She had Remicade three times back in 2000 and  subsequently developed a recurrent abscess after biologic therapy.  She  underwent ultrasound guided drainage at [facility].  She subsequently underwent a laparotomy by Dr.  Kathyrn Lass at [facility] and underwent a right oophorectomy,  removal of some type of cystic tumor which was benign and subsequently  underwent a sigmoid colectomy during a separate procedure.  She last  underwent a colonoscopy by Dr. Laural Golden on May 29, 2007, and he found  active disease in her sigmoid and descending colon, a polyp in her  distal sigmoid  which turned out to be benign and also ileitis  was  present endoscopically and on histology.   Over the years, she has tried a number of agents.  She develops joint  pain with all mesalamines.  She tells me she though her throat was  closing off with Remicade previously and felt generally terrible without  being specific while on the 6MP previously.  She has tolerated all  antibiotics and corticosteroid therapy previously.   MEDICATIONS ON ADMISSION:  1. Prednisone 30 mg daily.  2. Cipro, dose unknown.   ALLERGIES:  NO KNOWN DRUG ALLERGIES.   FAMILY HISTORY:  Negative for chronic GI or liver disease.   SOCIAL HISTORY:  Patient is married, has four children and one step-  child.  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 [employer].  She lives here in [place], [place].  She smokes one  pack of cigarettes about every three days.  No alcohol or illicit drug  use.   REVIEW OF SYSTEMS:  No chest pain, shortness of breath, no change in  weight recently.  Denies hematochezia, melena.  No reflux symptoms.  No  odynophagia, no dysphagia, early satiety, no nausea or vomiting.  Oral  intake has been well maintained.  No chest pain, dyspnea on exertion.  No fever or chills.   PHYSICAL EXAMINATION:  VITAL SIGNS:  Temperature 97.9, pulse 50,  respiratory rate 20, blood pressure 145/82.  GENERAL APPEARANCE:  A 55 year old lady resting comfortably.  She is  wearing glasses and appears pleasant and conversant, in no acute  distress.  SKIN:  Warm and dry.  There is no jaundice.  No stigmata of chronic  liver disease.  HEENT:  No scleral icterus.  Conjunctivae are pink.  Oral cavity with no  lesions.  CHEST:  Lungs are clear to auscultation.  CARDIOVASCULAR:  Regular rate and rhythm without murmurs, rubs, or  gallops.  ABDOMEN:  Well-healed surgical scars.  She has percutaneous drain in  place.  She has good bowel sounds.  Abdomen is entirely soft and  nontender without appreciable mass or organomegaly.   EXTREMITIES:  No edema.   ADMISSION LABORATORY DATA:  CBC revealed a white count of 19,800,  hemoglobin 11.7, hematocrit 35.6, MCV 88.4, platelet count 442,000,  white count 14.3.  On September 07, 2007, hemoglobin 10.1 and hematocrit  30.3, MCV 88.  Her liver enzymes were normal on August 18, 2007, with a  bilirubin of 0.5, alkaline phosphatase 48, AST 20, ALT 16, albumin 3.3.   IMPRESSION:  The patient is  a very pleasant 55 year old lady with well  documented Crohn's disease (colitis and ileitis) admitted to the  hospital with worsening of recent pelvic pain, was found to have an  apparent abscess on CT scan which lead to drainage.   At this point, she does not look very ill or toxic to me.   I am somewhat taken aback the fact that she did have overt pus in this  fluid and more organisms seen right off the bat.   She is now growing Escherichia coli in the fluid.   I am intrigued by the fact that she states she has done better with pig  worm larvae than anything else as far as traditional therapy for Crohn's  disease is concerned.  She feels that somehow she mechanically induced  this inflammatory process in her pelvis when she manually reduced her  uterus.  I suppose theoretically there could be a connection between the  two.   Management of her Crohn's has been apparently very challenging over the  years as outlined above.  She has had intolerances to a multitude of  traditional therapies and, according to the medical record,  noncompliance has been an issue in the past as well.   She tells me she has an appointment to see Dr. Laural Golden in his new office  in [place], [place], on September 15, 2007.   RECOMMENDATIONS:  For the time being, I certainly agree with  percutaneous decompression and drainage of the fluid collection.  She  will need a follow-up CT in a few days.  I need to leave the drain in  until output nadirs.   Would advocate her being changed fairly soon from  Solu-Medrol to oral  prednisone and would also recommend she continue on Cipro and Flagyl  orally.   I did give Dr. Langston Reusing at [facility] a call and just ran the case by him.  He tells me that pig worm  larvae will never be approved therapy in this country as it is a  burdensome and labor-intensive endeavor, not even proven outside of this  country.  Whether or not ingestion of this unapproved treatment for  Crohn's has anything to do with her acute illness remains to be seen.   It appears the only other traditional therapy that is available for this  nice lady would be another course of biologic therapy (i.e., Humira)  once the current infectious/inflammatory process has been eradicated and  hopefully either on or off a very low dose of prednisone.   I would like to thank Jamesetta So, M.D., for allowing me to see this  very nice lady today.      Bridgette Habermann, M.D.  Electronically Signed     RMR/MEDQ  D:  09/07/2007  T:  09/07/2007  Job:  [id number]   cc:   Jonnie Kind, M.D.  Fax: [phone]   Hildred Laser, M.D.  Fax: [phone]

## 2011-05-07 NOTE — Consult Note (Signed)
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  She was referred by Dr. Woody Seller.  This is a pleasant  55 year old woman with a history of Crohn'Welch disease who had a right  subclavian vein Port-A-Cath placed in 2000.  She had multiple abdominal  operations around that time and the Port-A-Cath initially was used for  total parenteral nutrition but has subsequently been used for other  intravenous medications.  It was last used in April of 2010.  In April  of 2010 she had developed some facial symptoms and underwent a study  which showed a right subclavian vein DVT.  She was started on Coumadin  at that time and it was elected to leave the port in place.  She states  that really since January of 2010 she has had problems with neck and  facial swelling, head pressure, dizziness and a purplish discoloration  of her face associated with lying flat and exertion.  She also noted  significant distention and collateral pain around her right shoulder.  She has also had problems with puffy eyelids.  Her symptoms are  alleviated by head elevation and aggravated by exertion and lying flat.  Of note the Port-A-Cath was placed by Dr. Arnoldo Morale in [place].   PAST MEDICAL HISTORY:  Her past medical history is significant for  Crohn'Welch disease.  She denies any history of diabetes, hypertension,  hypercholesterolemia, history of previous myocardial infarction or  history of congestive heart failure.   FAMILY HISTORY:  She is unaware of any history of premature  cardiovascular disease.   SOCIAL HISTORY:  She is single.  She has four children.  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  She quit tobacco in 2008.   REVIEW OF SYSTEMS:  She has had some weight gain.  She is 230 pounds, 5  feet 6 inches  tall.  CARDIOVASCULAR:  She has had no chest pain, chest pressure, palpitations  or arrhythmias.  She has had no claudication, rest pain or nonhealing  ulcers.  She has had no history of stroke, TIAs or amaurosis fugax.  MUSCULOSKELETAL:  She does have a history of arthritis and joint pain.  PSYCHIATRIC:  She has had depression in the past.  SKIN:  She has had a history of pyoderma granulosum.  Pulmonary, GI, GU, ENT, hematologic review of systems is unremarkable.   PHYSICAL EXAMINATION:  General:  This is a pleasant 55 year old woman  who appears her stated age.  Vital signs:  Blood pressure is 123/73,  heart rate is 80, temperature is 97.  HEENT:  Pupils are equal, round  and reactive to light and accommodation.  Extraocular motions are  intact.  Conjunctivae are normal.  Currently I do not see any  significant facial or neck swelling.  She does have some distended veins  in her neck on the right.  Lungs:  Clear bilaterally to auscultation  without rales, rhonchi or wheezing.  Cardiovascular:  I do not detect  any carotid bruits.  She has a regular rate and rhythm.  She has  palpable femoral and pedal pulses bilaterally.  She has mild  peripheral  edema.  Abdomen:  Soft and nontender with normal pitched bowel sounds.  No masses are appreciated.  Musculoskeletal:  There are no major  deformities or cyanosis.  Neurological:  She has no focal weakness or  paresthesias.  Skin:  There are no ulcers or rashes.  Lymphatic:  There  is no significant cervical, axillary or inguinal lymphadenopathy noted.   I have reviewed her lab studies which show a potassium of 4.0, H and H  11.5 and 33.5, white blood cell count is 8.5, platelets 207,000.   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  I have recommended that she  have her Port-A-Cath removed as currently it is not being used and  certainly this could potentially help relieve her  symptoms.  Coumadin  would have to be stopped prior to removal of her Port-A-Cath but then I  would suggest resuming this after the procedure with plans for a  followup venous duplex scan in 3-6 months to help determine how long to  continue her Coumadin.  If her symptoms do not resolve after removal of  her Port-A-Cath I would recommend a bilateral upper extremity venogram  and central venogram to evaluate for central venous stenoses.  If she  was found to have a significant central venous stenosis this could  potentially be addressed with angioplasty and stenting to help relieve  her symptoms.  Certainly this far out from her original blood clot in  the right subclavian vein she is not a candidate for thrombolysis  although she could have a central venous stenosis again amenable to  angioplasty.  She is comfortable on following up with Dr. Arnoldo Morale to  discuss having the Port-A-Cath removed and certainly we would be happy  to help in any way that we could.  If necessary would be happy to  arrange for her central venogram.  Will see her back p.r.n.     Judeth Cornfield. Scot Dock, M.D.  Electronically Signed   CSD/MEDQ  D:  05/10/2010  T:  05/11/2010  Job:  [id number]   cc:   Jerene Bears, MD

## 2011-05-10 NOTE — H&P (Signed)
NAME:  Kiara Welch, Kiara Welch                 ACCOUNT NO.:  [id number]   MEDICAL RECORD NO.:  [id number]          PATIENT TYPE:  AMB   LOCATION:  DAY                           FACILITY:  [facility]   PHYSICIAN:  Hildred Laser, M.D.    DATE OF BIRTH:  [birth year]   DATE OF ADMISSION:  DATE OF DISCHARGE:  LH                              HISTORY & PHYSICAL   PRIMARY CARE PHYSICIAN:  Tesfaye D. Legrand Rams, M.D.   HISTORY OF PRESENT ILLNESS:  [name] is a 55 year old Caucasian female  with a history of Crohn's colitis dating back over 25 years, who  presents for further evaluation of recent abdominal pain.  She works for  [employer] as a Marine scientist.  She was last seen in our practice in  March, 2005.  At that time, she was actually doing very well on  methotrexate.  She states that since then, she came off the medication  shortly after that.  She states that she was unable to tolerate it due  to side effects, feeling bloated, and just not feeling well on the  medication.  This seems to be a typical pattern with the patient.  She  has been on multiple muzolimine preps, but discontinued secondary to  joint pain.  She states that she was unable to tolerate 6MP.  We  generally see her every few years, then she is loss to follow up.   Recently, she developed abdominal pain in the mid abdomen.  She states  that she was having a bowel movement and heard a pop.  She states that  she had similar symptoms when she had intra-abdominal abscesses before.  She also notes that her ventral hernia has returned.  This was repaired  back in 2006 by Dr. Arnoldo Morale.   She recently called Dr. Josephine Cables office after developing abdominal pain.  She had a CT of the abdomen and pelvis on March 24, 2007.  This was  compared to a February 6 exam.  There was apparent repair of her  anterior abdominal wall hernia.  There was diffuse stenting of the  anterior abdominal wall musculature and omental fat protrudes to the  small defect on the  right and on the left.  There was no herniated  bowel.  She had proximal transverse colon demonstrating luminal  narrowing and probable transmural thickening extending over  approximately 7 cm.  She also had a new wall thickening and inflammation  surrounding the sigmoid colon and chronic thickening of the walls of the  rectum, which were stable.  The patient has not been on any maintenance  therapy for Crohn's disease since 2005.  She states that she often feels  bloated.  She has intermittent abdominal pain in the epigastric area and  down in the left lower abdomen.  She generally has three loose bowel  movements daily.  Rarely has a formed stool.  She has intermittent  hematochezia quite frequently.  She says she had some blood work  recently, and her white count was slightly elevated around 12,000.  Denies any fevers or chills.  She complains  of dysuria for one week but  no hematuria.  Denies any joint pain.  She does have fecal incontinence  at times.   CURRENT MEDICATIONS:  None.   ALLERGIES:  No known drug allergies.   PAST MEDICAL HISTORY:  History of Crohn's colitis since age 83 or 21.  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  In  January, 2000, she had exploratory laparotomy and drainage of intra-  abdominal abscess, along with a left oophorectomy and appendectomy.  In  March, 2000, she had another exploratory laparotomy for recurrent intra-  abdominal abscess.  She also had a Port-A-Cath placement.  She received  Remicade three times in 2000.  In October, 2000, she had recurrent intra-  abdominal abscess, underwent ultrasound[facility]guided drainage at [facility].  In November, 2000, however, she  ended up needing surgery by Dr. 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  In March, 2001, she had a  sigmoid colectomy, again by Dr. Kathyrn Lass.  Last colonoscopy was in  April, 2004.  She had multiple ulcers at the distal  rectum/sigmoid/descending colon.  A few small ulcers were noted at the  terminal ileum.  Biopsy was consistent with active Crohn's disease.   FAMILY HISTORY:  Negative for colorectal cancer.   SOCIAL HISTORY:  She is married with four children.  She is employed at  [employer] as a Marine scientist.  She smokes one pack of cigarettes every  three days.  No alcohol use.   REVIEW OF SYSTEMS:  See HPI for GI, GU.  CARDIOPULMONARY:  No chest pain  or shortness of breath.  CONSTITUTIONAL:  No weight loss.   PHYSICAL EXAMINATION:  VITAL SIGNS:  Weight 226.  Height 5 feet 6.  Temp  98.5, blood pressure 120/84, pulse 68.  GENERAL:  A pleasant, obese Caucasian female in no acute distress.  SKIN:  Warm and dry.  No jaundice.  HEENT:  Sclerae are anicteric.  Oropharyngeal mucosa is moist and pink.  CHEST:  Lungs are clear to auscultation.  CARDIAC:  Regular rate and rhythm.  No murmurs, rubs or gallops.  ABDOMEN:  Positive bowel sounds.  Obese but symmetrical.  She has some  weakening at the ventral area.  She has some tenderness over this area  as well as in the left lower quadrant region.  No organomegaly or  masses.  No rebound tenderness or guarding.  EXTREMITIES:  No edema.   IMPRESSION:  [name] is a 55 year old lady with a history of Crohn's  colitis, dating back well over 25 years.  Recent CT reveals active  Crohn's colitis with proximal transverse colon luminal narrowing and  transmural thickening for 7 cm as well as new wall thickening and  inflammation of the sigmoid colon, chronic thickening of the walls of  the rectum.  Unfortunately, she is off her maintenance therapy again,  and this has been so in 2005.  She seems to be intolerant to most of the possible medications, including the muzolimine prep, 6MP, Imuran,  and  methotrexate.  She has tolerated Remicade in the past.  She needs to  have a colonoscopy for further evaluation of the above CT findings.  In  addition, she should be on a fairly strict surveillance schedule, as she  is at increased  risk for colorectal cancer.  Her last colonoscopy was  over 3-4 years ago.   PLAN:  1. I have discussed the case with Dr. Laural Golden.  We will proceed with      colonoscopy.  2. Begin Cipro 500 mg p.o. b.i.d. x4 weeks.  A prescription provided      with 0 refills.  3. Begin prednisone 10 mg, take 3 p.o. daily x10 days, then 20 mg      daily until further directions, #100, 0 refills      given.  4. Colonoscopy with Dr. Laural Golden in the near future.  5. Further recommendations to follow.      Neil Crouch, P.A.      Hildred Laser, M.D.  Electronically Signed    LL/MEDQ  D:  04/16/2007  T:  04/16/2007  Job:  [id number]   cc:   Tesfaye D. Legrand Rams, MD  Fax: [phone]

## 2011-05-10 NOTE — H&P (Signed)
NAME:  Kiara Welch, Kiara Welch                 ACCOUNT NO.:  [id number]   MEDICAL RECORD NO.:  [id number]          PATIENT TYPE:  INP   LOCATION:  A304                          FACILITY:  [facility]   PHYSICIAN:  Jamesetta So, M.D.  DATE OF BIRTH:  1956-04-21   DATE OF ADMISSION:  02/02/2005  DATE OF DISCHARGE:  LH                                HISTORY & PHYSICAL   CHIEF COMPLAINT:  Abdominal pain.   HISTORY OF PRESENT ILLNESS:  The patient is a 55 year old white female who  presented with an acute onset of abdominal pain, nausea, vomiting.  She  states she has had incisional hernia around the umbilicus for many months  and has been reducing it herself.  She was unable to reduce the hernia and  presented to the emergency room with nausea and vomiting.  A CT scan of the  abdomen and pelvis revealed an incarcerated incisional hernia in the  periumbilical region.   Since her admission, the hernia has reduced.   PAST MEDICAL HISTORY:  Crohn's disease.   PAST SURGICAL HISTORY:  Exploratory laparotomy, partial colectomy in the  past.   CURRENT MEDICATIONS:  None.   ALLERGIES:  NO KNOWN DRUG ALLERGIES.   REVIEW OF SYSTEMS:  The patient does smoke.  She denies any significant  alcohol use.   PHYSICAL EXAMINATION:  The patient is a well-developed, well-nourished,  white female in no acute distress.  She is afebrile and vital signs are  stable.   LUNGS:  Clear to auscultation with equal breath sounds bilaterally.  HEART:  Regular rate and rhythm without S3, S4 or murmur.  ABDOMEN:  Soft, with a reduced periumbilical incisional hernia.  She has two  areas of defect, one just superior to the umbilicus and one to the right of  the umbilicus.  No masses are noted.   White blood cell count 11.2, hematocrit 37, platelet count 248.  Med 7 was  remarkable for potassium of 3.4.   IMPRESSION:  Incisional hernia with incarceration, reduced.   PLAN:  The patient is admitted for potassium  supplementation.  She  subsequently will an incisional herniorrhaphy with alloderm graft repair  tomorrow.  The risks and benefits of the procedure including bleeding,  infection and recurrence of the hernia were fully explained to the patient.  Gave informed consent.      MAJ/MEDQ  D:  02/03/2005  T:  02/03/2005  Job:  [id number]

## 2011-05-10 NOTE — Procedures (Signed)
NAME:  Kiara Welch, Kiara Welch                 ACCOUNT NO.:  [id number]   MEDICAL RECORD NO.:  [id number]          PATIENT TYPE:  OUT   LOCATION:  RAD                           FACILITY:  [facility]   PHYSICIAN:  Leslye Peer, MD       DATE OF BIRTH:  1956-10-22   DATE OF PROCEDURE:  07/07/2006  DATE OF DISCHARGE:                                  ECHOCARDIOGRAM   INDICATIONS:  A 55 year old female, without prior cardiac history, who is  being referred for shortness of breath and dizziness.   TECHNICAL:  Quality of the study is adequate.   The aorta measures normally at 3.1 cm.   Left atrium also measures normally at 3.7 cm.   The interventricular septum is mildly thickened, measured at 1.3 cm, while  the posterior wall was within normal limits at 1.0 cm.   The aortic valve is thin, trileaflet and pliable with normal leaflet  excursion.  No aortic insufficiency is noted.  Doppler interrogation of  aortic valve is within normal limits.   The mitral valve also appears to have normal leaflet excursion.  No mitral  valve prolapse is noted.  Trivial mitral regurgitation is noted.  Doppler  interrogation of the mitral valve is within normal limits.  There is a small  mobile echodensity in the left ventricle, below the mitral valve, which is  likely most consistent with a lax chordae versus a torn minor subvalvular  apparatus, but the possibility of a vegetation cannot be entirely excluded  on this study, however, location and character would be quite unusual for  this.   Pulmonic valve is incompletely visualized, but appeared to be grossly  structurally normal.   Tricuspid valve also appeared grossly structurally normal with mild  tricuspid regurgitation noted.   The left ventricle is normal in size with the LVIDd measured at 4.2 cm an  LVIC measured at 3.2 cm.  Overall left systolic function is normal and no  regional wall motion abnormalities are noted.  There is no suggestion of  diastolic  dysfunction on this study.   The right atrium and right ventricle normal size, and right ventricular  systolic function is normal.   IMPRESSION:  1.  Mild asymmetric septal hypertrophy.  2.  Trivial mitral and mild tricuspid regurgitation.  3.  Normal left ventricular size and systolic function without regional wall      motion abnormality noted.  4.  Small mobile echodensity in the left ventricle, below the mitral valve,      most consistent with a lax chordae      versus a torn minor subvalvular apparatus, although the possibility of      vegetation cannot be entirely excluded, however, the location and      character would be unusual for this.  Consider transesophageal      echocardiogram if clinically indicated.           ______________________________  Leslye Peer, MD     AB/MEDQ  D:  07/07/2006  T:  07/08/2006  Job:  [id number]   cc:   Tesfaye D.  Legrand Rams, MD  Fax: [phone]   Leslye Peer, MD  Fax: [phone]

## 2011-05-10 NOTE — Op Note (Signed)
NAME:  Kiara Welch, Kiara Welch                 ACCOUNT NO.:  [id number]   MEDICAL RECORD NO.:  [id number]          PATIENT TYPE:  AMB   LOCATION:  DAY                           FACILITY:  [facility]   PHYSICIAN:  Hildred Laser, M.D.    DATE OF BIRTH:  11/24/1956   DATE OF PROCEDURE:  DATE OF DISCHARGE:                               OPERATIVE REPORT   PROCEDURE:  Colonoscopy with terminal ileoscopy.   INDICATIONS:  Kiara Welch is a 55 year old Caucasian female who was diagnosed  with inflammatory bowel disease when she was 89 or 55 years old.  She  has had predominately colitis.  She has had a complicated course.  Back  in 2000 she required multiple surgeries for a large abscess.  She had to  be transferred to [facility] where she had he had another surgery.  She was  also given Remicade fusion.  She has been intolerant of mesalamine and  mercaptopurine but unfortunately she has also been noncompliant.  She  was seen in the office with left-sided abdominal pain and diarrhea and  incontinence.  She had CT which suggests thickening to proximal  transverse colon as well as a stricture and mural thickening to left or  sigmoid colon.  She is undergoing diagnostic colonoscopy.  Procedure and  risks were reviewed the patient, informed consent was obtained.  She is  undergoing colonoscopy both for diagnostic and surveillance purposes.  Procedure risks were reviewed the patient, informed consent was  obtained.   MEDICATIONS FOR CONSCIOUS SEDATION:  Morphine sulfate total of 10 mg IV,  Versed 10 mg IV.   FINDINGS:  Procedure performed in endoscopy suite.  The patient's vital  signs and O2 sat were monitored during procedure and remained stable.  The patient was placed in the left lateral recumbent position and rectal  examination performed.  No abnormality noted on external or digital  exam.  Pentax videoscope was placed in the rectum and advanced to the  sigmoid colon.  Mucosa of sigmoid colon was pale with loss of  haustral  markings.  Starting about 30 cm she had luminal narrowing with multiple  ulcers.  This segment was about 10 cm long.  The scope was passed  through this proximally.  Preparation was excellent.  The scope was  advanced into the cecum which was identified by appendiceal stump and  ileocecal valve.  She had three small ulcers involving terminal ileum.  Pictures taken along with a biopsy but could not get a biopsy from the  ulcer margin.  Biopsy was taken from mucosa surrounding these ulcers  which was erythematous.  As the scope was withdrawn colonic mucosa was  once again carefully examined.  There was single diverticulum at hepatic  flexure.  Mucosa of the transverse colon and splenic flexure was normal.  This segment with luminal narrowing and ulceration was biopsied.  There  was a polyp at distal sigmoid colon suspicious for inflammatory polyp.  This was biopsied for histology.  Rectal mucosa was normal.  Scope was  retroflexed to examine anorectal junction which was unremarkable.  Endoscope was then withdrawn.  The patient tolerated the procedure well.   FINAL DIAGNOSES:  1. 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  Biopsy      taken from this area.  2. A single diverticulum at hepatic flexure.  3. Multiple ulcers with luminal narrowing involving sigmoid and      descending colon.  This area was biopsied for histology.  4. Polyp at distal sigmoid colon, possibly inflammatory pseudopolyp      and this was biopsied for histology.   RECOMMENDATIONS:  1. The patient has arranged for helminthic therapy.  She has an order      filled from [place] and that is what she is interested in trying.  2. In my opinion she needs to consider Remicade or Humira.  3. I will be contacting the patient with the results of biopsy and we      can determine the next step.      Hildred Laser, M.D.  Electronically Signed     NR/MEDQ  D:  05/29/2007  T:  05/29/2007  Job:   [id number]   cc:   Brandon Melnick D. Legrand Rams, MD  Fax: [phone]

## 2011-05-10 NOTE — Discharge Summary (Signed)
NAME:  Kiara Welch, DRAGOO NO.:  [id number]   MEDICAL RECORD NO.:  [id number]          PATIENT TYPE:  INP   LOCATION:  A304                          FACILITY:  [facility]   PHYSICIAN:  Jamesetta So, M.D.  DATE OF BIRTH:  03-04-1956   DATE OF ADMISSION:  02/02/2005  DATE OF DISCHARGE:  02/14/2006LH                                 [place] COURSE SUMMARY:  The patient is a 55 year old white female status  post multiple abdominal surgeries in the past who presented with an  incarcerated incisional hernia.  She was admitted to the hospital for  further evaluation and treatment.  The hernia was reduced without  difficulty.  She subsequently underwent incisional herniorrhaphy on February 04, 2005.  She tolerated the procedure well.  Her postoperative course was  unremarkable.  Her diet was advanced without difficulty.   CONDITION ON DISCHARGE:  The patient is being discharged home on  postoperative day #1 on good and improving condition.   DISCHARGE INSTRUCTIONS:  The patient is to do no heavy lifting.  She is to  wear abdominal binder during the day.  She is to follow up with Dr. Aviva Signs on February 14, 2005.   DISCHARGE MEDICATIONS:  Percocet 10/325 mg one to two tablets p.o. q.4h. PRN  pain.   PRINCIPAL DIAGNOSES:  1.  Incisional hernia.  2.  History of Crohn's disease.  3.  Incarcerated hernia, resolved.   PRINCIPAL PROCEDURE:  Incisional herniorrhaphy on February 04, 2005.      MAJ/MEDQ  D:  02/05/2005  T:  02/05/2005  Job:  [id number]   cc:   Tesfaye D. Legrand Rams, [place]  [place]  [place] [address]  Fax: [phone]

## 2011-05-10 NOTE — Op Note (Signed)
NAME:  Kiara Welch, Kiara Welch                           ACCOUNT NO.:  [id number]   MEDICAL RECORD NO.:  [id number]                   PATIENT TYPE:  AMB   LOCATION:  DAY                                  FACILITY:  [facility]   PHYSICIAN:  Hildred Laser, M.D.                 DATE OF BIRTH:  03-Mar-1956   DATE OF PROCEDURE:  04/21/2003  DATE OF DISCHARGE:                                 OPERATIVE REPORT   PROCEDURE:  Total colonoscopy.   INDICATIONS FOR PROCEDURE:  The patient is a 55 year old Caucasian female  with known history of Crohn's disease who is presently on no maintenance  therapy who has recurrent diarrhea and has had some rectal bleeding.  She is  undergoing a diagnostic procedure.  She has had Crohn's disease for over 20  years.  A few years ago she had multiple surgeries for intra-abdominal  abscesses, and she also had a fistula.  The procedure was reviewed with the  patient, and informed consent was obtained.   PREOPERATIVE MEDICATIONS:  Morphine sulfate 10 mg IV in divided doses,  Versed 7 mg IV in divided doses.   INSTRUMENT USED:  Olympus video system.   FINDINGS:  The procedure was performed in the endoscopy suite.  The  patient's vital signs and O2 saturations were monitored during the procedure  and remained stable.  The patient was placed in the left lateral position  and rectal examination performed.  This was within normal limits.  The scope  was placed into the rectum where multiple ulcers were noted.  These also  involved the sigmoid and/or descending colon.  Multiple pictures were taken  along with a biopsy on the way out.  The preparation was satisfactory.  The  scope was passed to the cecum which was identified by the appendiceal  orifice/stump and ileocecal valve.  The TR was examined, and a few ulcers  were noted in the distal-most segment of TR.  Pictures were taken along with  the biopsy.  The mucosa of the cecum, ascending colon, and transverse colon  was  normal.  She had scattered diverticula at the transverse colon.  The  mucosa of the left colon revealed erythema, granularity, erosions, and  ulcers, as described above.  Multiple biopsies were taken from the distal  sigmoid colon.  I did not attempt to retroflex the scope in the rectum.  The  endoscope was withdrawn.  The patient tolerated the procedure well.   FINAL DIAGNOSIS:  Multiple ulcers at distal rectum and sigmoid/descending  colon.  A few small ulcers were also noted at the terminal ileum.  These  changes are typical of active Crohn's disease.    PLAN:  Will start her on methotrexate 15 mg p.o. weekly.  Will make plans  for her to receive Remicade at a dose of 5 mg per kg.  She will be  premedicated with _____ and  Zyrtec.  She will not need a PPD because she had  one in 07/2002.                                               Hildred Laser, M.D.    NR/MEDQ  D:  04/21/2003  T:  04/21/2003  Job:  [id number]   cc:   Tesfaye D. Legrand Rams, M.D.  [address]  [place]  [place] [address]  Fax: [phone]

## 2011-05-10 NOTE — Op Note (Signed)
NAME:  Kiara Welch, Kiara Welch                 ACCOUNT NO.:  [id number]   MEDICAL RECORD NO.:  [id number]          PATIENT TYPE:  INP   LOCATION:  A304                          FACILITY:  [facility]   PHYSICIAN:  Jamesetta So, M.D.  DATE OF BIRTH:  1956/11/06   DATE OF PROCEDURE:  02/04/2005  DATE OF DISCHARGE:                                 OPERATIVE REPORT   PREOPERATIVE DIAGNOSIS:  Incisional hernia.   POSTOPERATIVE DIAGNOSIS:  Incisional hernia.   PROCEDURE:  Incisional herniorrhaphy.   SURGEON:  Jamesetta So, M.D.   ANESTHESIA:  General endotracheal.   INDICATIONS:  The patient is a 55 year old white female who presents with an  incisional hernia in the periumbilical region. She has had multiple  abdominal surgeries in the past. She did have some incarcerated bowel  present in that region, but this was reduced without difficulty. Risks and  benefits of the procedure including bleeding, infection, the possibility of  mesh placement were fully explained to the patient, who gave informed  consent.   PROCEDURE NOTE:  The patient was placed in supine position. After induction  of general endotracheal anesthesia, the abdomen was prepped and draped in  the usual sterile technique with Betadine. Surgical site confirmation was  performed.   An incision was made through the previous surgical scar around the umbilical  region. This was taken down to the fascia. The hernia defect was found. The  hernia sac was noted, and omentum was noted within this. This was reduced  without difficulty. There were two areas of an incisional hernia, and these  were converted into one. No incarcerated bowel was present. The fascial  edges were reapproximated primarily using 0 Prolene interrupted sutures as  well as overlying layer of O Surgitek interrupted sutures in a transverse  manner. The defect was not large enough to warrant mesh or AlloDerm graft  placement. The subcutaneous layer was reapproximated  using a 2-0 Vicryl  interrupted suture. The skin was closed using staples, and 0.5 cm  Sensorcaine was instilled in the surrounding wound. Betadine ointment, dry  sterile dressing were applied.   All tape and needle counts were correct at the end of the procedure. The  patient was extubated in the operating room and went back to the recovery  room awake in stable condition.   COMPLICATIONS:  None.   SPECIMEN:  None.   BLOOD LOSS:  Minimal.      MAJ/MEDQ  D:  02/04/2005  T:  02/04/2005  Job:  [id number]

## 2011-05-15 ENCOUNTER — Ambulatory Visit
Admission: RE | Admit: 2011-05-15 | Discharge: 2011-05-15 | Disposition: A | Discharge status: Home / Self Care | Payer: Medicare Other | Source: Ambulatory Visit | Attending: Interventional Radiology | Admitting: Interventional Radiology

## 2011-05-15 DIAGNOSIS — Z95820 Peripheral vascular angioplasty status with implants and grafts: Secondary | ICD-10-CM

## 2011-05-15 DIAGNOSIS — I871 Compression of vein: Secondary | ICD-10-CM

## 2011-05-27 ENCOUNTER — Other Ambulatory Visit (INDEPENDENT_AMBULATORY_CARE_PROVIDER_SITE_OTHER): Payer: Self-pay | Admitting: Internal Medicine

## 2011-05-27 ENCOUNTER — Encounter (INDEPENDENT_AMBULATORY_CARE_PROVIDER_SITE_OTHER): Payer: Medicare Other | Admitting: Internal Medicine

## 2011-05-27 ENCOUNTER — Ambulatory Visit (HOSPITAL_COMMUNITY)
Admission: RE | Admit: 2011-05-27 | Discharge: 2011-05-27 | Disposition: A | Discharge status: Home / Self Care | Payer: Medicare Other | Source: Ambulatory Visit | Attending: Internal Medicine | Admitting: Internal Medicine

## 2011-05-27 DIAGNOSIS — Z9049 Acquired absence of other specified parts of digestive tract: Secondary | ICD-10-CM | POA: Insufficient documentation

## 2011-05-27 DIAGNOSIS — Z01818 Encounter for other preprocedural examination: Secondary | ICD-10-CM

## 2011-05-27 DIAGNOSIS — Z860100 Personal history of colon polyps, unspecified: Secondary | ICD-10-CM | POA: Insufficient documentation

## 2011-05-27 DIAGNOSIS — K632 Fistula of intestine: Secondary | ICD-10-CM | POA: Insufficient documentation

## 2011-05-27 DIAGNOSIS — Z933 Colostomy status: Secondary | ICD-10-CM | POA: Insufficient documentation

## 2011-05-27 DIAGNOSIS — K5289 Other specified noninfective gastroenteritis and colitis: Secondary | ICD-10-CM | POA: Insufficient documentation

## 2011-05-27 DIAGNOSIS — K6389 Other specified diseases of intestine: Secondary | ICD-10-CM

## 2011-05-27 DIAGNOSIS — K5090 Crohn's disease, unspecified, without complications: Secondary | ICD-10-CM

## 2011-05-27 DIAGNOSIS — K6289 Other specified diseases of anus and rectum: Secondary | ICD-10-CM | POA: Insufficient documentation

## 2011-06-23 NOTE — Op Note (Signed)
NAME:  Kiara Welch, Kiara Welch                 ACCOUNT NO.:  [id number]  MEDICAL RECORD NO.:  [id number]  LOCATION:  DAYP                          FACILITY:  [facility]  PHYSICIAN:  Hildred Laser, M.D.    DATE OF BIRTH:  Sep 10, 1956  DATE OF PROCEDURE:  05/27/2011 DATE OF DISCHARGE:                              OPERATIVE REPORT   PROCEDURE:  Colonoscopy via colostomy and examination of her rectosigmoid segment.  INDICATION:  Chris is 55 year old Caucasian female with over 30-year history of Crohn disease.  She has required multiple surgeries.  She had a right hemicolectomy around 2000, and she had sigmoid resection with colostomy in summer of 2008.  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  She has seen Dr. Cheryll Cockayne for this.  She had fistulogram a week before last and this study could not confirm that it connected to a small or large bowel.  Dr. Morton Stall is planning to do surgery since patient has bleeding and discharge from this wound every day.  Dr. Morton Stall, asked me to examine her colon to make sure she does not have neoplasm or stricture.  Procedures were reviewed with the patient.  Informed consent was obtained.  MEDS FOR CONSCIOUS SEDATION: 1. Demerol 50 mg IV. 2. Versed 10 mg IV.  FINDINGS:  Procedure performed in endoscopy suite.  The patient's vital signs and O2 sat were monitored during the procedure and remained stable.  The patient was placed in left lateral recumbent position. Rectal examination performed.  No abnormality noted on external or digital exam.  Pentax videoscope was placed in the rectum and advanced into rectosigmoid junction.  There was erythema to rectal mucosa with no erosions or ulcers were noted.  Endoscope was withdrawn.  The patient was placed in supine position.  Colostomy bag was removed.  Digital exam of colostomy revealed a stricture.  Pediatric colonoscope was initially used and could not pass it through this narrow segment, therefore  slim scope was used with diameter of about 11 mm.  I was able to pass the scope across the stricture into descending colon.  Scope was easily advanced in the area of hepatic flexure really where ileocolonic anastomosis was identified.  It was wide open.  Small bowel proximal to the anastomosis was examined for at least 15 cm and revealed more diffuse changes of edema, erythema, and scattered erosions or ulcers. Pictures were taken followed by biopsy.  Colonic mucosa was normal until back to the distal descending colon within 5-6 cm of colostomy.  There was edema with loss of vascularity erosions and an ulcer.  Once again biopsy was taken from this segment and endoscope was withdrawn.  I do suspect she must have bled from this segment when she took the prep yesterday.  Endoscope was withdrawn.  The patient tolerated the procedure well.  FINAL DIAGNOSES: 1. 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 2. Narrowing involving distal descending colon just inside the     colostomy with active disease in the form of friable mucosa with     erosions and ulcers.  Biopsy was taken from this area. 3. Rest of the colonic  mucosa was normal. 4. Active disease involving ileum, ileal mucosa proximal to     ileocolonic anastomosis only 15 cm were examined.  No stricture     noted in this segment.  RECOMMENDATIONS: 1. The patient advised to take calcium with vitamin D every day. 2. I will be contacting patient with results of biopsy. 3. We will also review results with Dr. Cheryll Cockayne, and we may     consider either CT or MR enterography prior to surgery.          ______________________________ Hildred Laser, M.D.     NR/MEDQ  D:  05/27/2011  T:  05/27/2011  Job:  [id number]  cc:   Cheryll Cockayne, MD Fax: [phone]  Jerene Bears, MD Fax: [phone]  Electronically Signed by Hildred Laser M.D. on 06/23/2011 12:41:04 PM

## 2011-08-15 ENCOUNTER — Telehealth (INDEPENDENT_AMBULATORY_CARE_PROVIDER_SITE_OTHER): Payer: Self-pay | Admitting: *Deleted

## 2011-08-15 NOTE — Telephone Encounter (Signed)
Kiara Welch CALLED SHE IS HAVING STEP SON PICK UP HER MEDS TODAY ,SHE WANTED YOU TO KNOW SHE HAS TALKED TO SEVERAL INS AND PRESCRPTN COMPANYS AND NONE OF THEM HAVE A 6 MP PROGRAM AND TO LET NUR KNOW HER SURGERY DATE IS FOR Monday 08/19/2011 SHE JUST WANTED YOU TO KNOW.

## 2011-08-16 NOTE — Telephone Encounter (Signed)
Noted, will route to Dr. Laural Golden for review.

## 2011-08-16 NOTE — Telephone Encounter (Signed)
Message noted.

## 2011-10-01 LAB — BODY FLUID CULTURE

## 2011-10-01 LAB — GRAM STAIN

## 2011-10-01 LAB — URINALYSIS, ROUTINE W REFLEX MICROSCOPIC
Bilirubin Urine: NEGATIVE
Ketones, ur: NEGATIVE
Nitrite: NEGATIVE
Specific Gravity, Urine: 1.015
Urobilinogen, UA: 0.2

## 2011-10-01 LAB — DIFFERENTIAL
Eosinophils Absolute: 0.1
Eosinophils Absolute: 0.3
Eosinophils Relative: 1
Lymphs Abs: 1.3
Lymphs Abs: 1.9
Monocytes Absolute: 0.4
Monocytes Relative: 3
Monocytes Relative: 8
Neutrophils Relative %: 64

## 2011-10-01 LAB — COMPREHENSIVE METABOLIC PANEL
ALT: 14
AST: 15
Albumin: 3.1 — ABNORMAL LOW
Calcium: 9.2
GFR calc Af Amer: >60
Sodium: 142
Total Protein: 6.8

## 2011-10-01 LAB — CBC
HCT: 40.4
MCHC: 33
MCV: 84.9
RBC: 4.77
RDW: 14.4 — ABNORMAL HIGH
WBC: 7.9

## 2011-10-01 LAB — CULTURE, ROUTINE-ABSCESS

## 2011-10-01 LAB — CULTURE, BLOOD (ROUTINE X 2)

## 2011-10-03 LAB — DIFFERENTIAL
Basophils Absolute: 0
Basophils Absolute: 0.1
Basophils Relative: 0
Eosinophils Absolute: 0
Eosinophils Relative: 0
Eosinophils Relative: 0
Lymphocytes Relative: 11 — ABNORMAL LOW
Lymphocytes Relative: 20
Monocytes Absolute: 0.5
Monocytes Absolute: 0.8 — ABNORMAL HIGH
Monocytes Relative: 6

## 2011-10-03 LAB — PROTIME-INR
INR: 1
Prothrombin Time: 13.2

## 2011-10-03 LAB — CBC
HCT: 30.3 — ABNORMAL LOW
HCT: 31.9 — ABNORMAL LOW
Hemoglobin: 10.1 — ABNORMAL LOW
MCHC: 33.3
MCHC: 33.4
Platelets: 364
Platelets: 350
RBC: 3.45 — ABNORMAL LOW
RDW: 12.8
RDW: 12.9
WBC: 13.8 — ABNORMAL HIGH

## 2011-10-03 LAB — BASIC METABOLIC PANEL
CO2: 28
GFR calc non Af Amer: >60
Glucose, Bld: 104 — ABNORMAL HIGH
Potassium: 3.4 — ABNORMAL LOW
Sodium: 138

## 2011-10-03 LAB — CULTURE, ROUTINE-ABSCESS

## 2011-10-03 LAB — ANAEROBIC CULTURE

## 2011-10-04 LAB — URINALYSIS, ROUTINE W REFLEX MICROSCOPIC
Bilirubin Urine: NEGATIVE
Glucose, UA: NEGATIVE
Glucose, UA: NEGATIVE
Hgb urine dipstick: NEGATIVE
Ketones, ur: NEGATIVE
Ketones, ur: NEGATIVE
Nitrite: NEGATIVE
Nitrite: NEGATIVE
Protein, ur: NEGATIVE
Protein, ur: NEGATIVE
Specific Gravity, Urine: 1.015
Urobilinogen, UA: 0.2
pH: 5.5
pH: 6.5
pH: 8

## 2011-10-04 LAB — CULTURE, ROUTINE-ABSCESS

## 2011-10-04 LAB — DIFFERENTIAL
Basophils Absolute: 0
Basophils Absolute: 0
Basophils Relative: 0
Eosinophils Absolute: 0
Eosinophils Relative: 0
Eosinophils Relative: 2
Lymphocytes Relative: 8 — ABNORMAL LOW
Lymphocytes Relative: 15
Lymphs Abs: 1.5
Neutro Abs: 7.7
Neutrophils Relative %: 89 — ABNORMAL HIGH

## 2011-10-04 LAB — BASIC METABOLIC PANEL
BUN: 6
Creatinine, Ser: 0.64
GFR calc non Af Amer: >60
Glucose, Bld: 125 — ABNORMAL HIGH

## 2011-10-04 LAB — CBC
HCT: 35.6 — ABNORMAL LOW
HCT: 36.4
Hemoglobin: 12.4
MCV: 88.6
Platelets: 442 — ABNORMAL HIGH
RBC: 4.11
RDW: 12.6
WBC: 19.8 — ABNORMAL HIGH
WBC: 9.9

## 2011-10-04 LAB — URINE CULTURE
Colony Count: 2000
Special Requests: NEGATIVE

## 2011-10-04 LAB — COMPREHENSIVE METABOLIC PANEL
Alkaline Phosphatase: 48
BUN: 8
CO2: 27
Chloride: 103
Creatinine, Ser: 0.67
GFR calc non Af Amer: >60
Total Bilirubin: 0.5

## 2011-10-04 LAB — LIPASE, BLOOD: Lipase: 16

## 2012-03-09 ENCOUNTER — Telehealth: Payer: Self-pay | Admitting: Emergency Medicine

## 2012-03-09 ENCOUNTER — Other Ambulatory Visit: Payer: Self-pay | Admitting: Interventional Radiology

## 2012-03-09 DIAGNOSIS — I871 Compression of vein: Secondary | ICD-10-CM

## 2012-03-09 NOTE — Telephone Encounter (Signed)
PT CALLED W/ C/C OF MILD INCREASING SOB,HEADACHES W/ PRESSURE, FLUID FILLED POCKETS UNDER EYES AND HAND EDEMA.  THIS HAS BEEN INCREASING OVER THE PAST MONTH.  MADE AN APPT. TO F/U W/ DR Kathlene Cote FOR 03-17-12 AT 1:00PM

## 2012-03-17 ENCOUNTER — Inpatient Hospital Stay: Admission: RE | Admit: 2012-03-17 | Payer: Medicare Other | Source: Ambulatory Visit

## 2012-03-19 ENCOUNTER — Inpatient Hospital Stay: Admission: RE | Admit: 2012-03-19 | Payer: Medicare Other | Source: Ambulatory Visit

## 2012-04-01 ENCOUNTER — Ambulatory Visit
Admission: RE | Admit: 2012-04-01 | Discharge: 2012-04-01 | Disposition: A | Discharge status: Home / Self Care | Payer: Medicare Other | Source: Ambulatory Visit | Attending: Interventional Radiology | Admitting: Interventional Radiology

## 2012-04-01 DIAGNOSIS — R0602 Shortness of breath: Secondary | ICD-10-CM | POA: Diagnosis not present

## 2012-04-01 DIAGNOSIS — I871 Compression of vein: Secondary | ICD-10-CM

## 2012-04-01 DIAGNOSIS — J811 Chronic pulmonary edema: Secondary | ICD-10-CM | POA: Diagnosis not present

## 2012-04-01 DIAGNOSIS — R0681 Apnea, not elsewhere classified: Secondary | ICD-10-CM | POA: Diagnosis not present

## 2012-04-01 NOTE — Progress Notes (Signed)
Pt states that she has been experiencing dyspnea x 4-5 months.  The past month has begun experiencing h/a's & edema. Headache gets worse when lying down.  Sleep apnea worse x 1 month.  Feels that color has been pale around lips w/ exertion.    Weight increase approx 7 lbs over the past month.

## 2012-04-02 ENCOUNTER — Other Ambulatory Visit (HOSPITAL_COMMUNITY): Payer: Self-pay | Admitting: Interventional Radiology

## 2012-04-02 DIAGNOSIS — I871 Compression of vein: Secondary | ICD-10-CM

## 2012-04-03 ENCOUNTER — Other Ambulatory Visit: Payer: Self-pay | Admitting: Radiology

## 2012-04-06 ENCOUNTER — Encounter (HOSPITAL_COMMUNITY): Payer: Self-pay | Admitting: Pharmacy Technician

## 2012-04-07 ENCOUNTER — Other Ambulatory Visit: Payer: Self-pay | Admitting: Interventional Radiology

## 2012-04-07 ENCOUNTER — Encounter (HOSPITAL_COMMUNITY): Payer: Self-pay

## 2012-04-07 ENCOUNTER — Ambulatory Visit (HOSPITAL_COMMUNITY)
Admission: RE | Admit: 2012-04-07 | Discharge: 2012-04-07 | Disposition: A | Discharge status: Home / Self Care | Payer: Medicare Other | Source: Ambulatory Visit | Attending: Interventional Radiology | Admitting: Interventional Radiology

## 2012-04-07 ENCOUNTER — Other Ambulatory Visit (HOSPITAL_COMMUNITY): Payer: Self-pay | Admitting: Interventional Radiology

## 2012-04-07 DIAGNOSIS — I871 Compression of vein: Secondary | ICD-10-CM

## 2012-04-07 DIAGNOSIS — R0602 Shortness of breath: Secondary | ICD-10-CM | POA: Insufficient documentation

## 2012-04-07 DIAGNOSIS — R221 Localized swelling, mass and lump, neck: Secondary | ICD-10-CM | POA: Insufficient documentation

## 2012-04-07 DIAGNOSIS — R220 Localized swelling, mass and lump, head: Secondary | ICD-10-CM | POA: Diagnosis not present

## 2012-04-07 DIAGNOSIS — K5090 Crohn's disease, unspecified, without complications: Secondary | ICD-10-CM | POA: Insufficient documentation

## 2012-04-07 HISTORY — DX: Shortness of breath: R06.02

## 2012-04-07 HISTORY — DX: Crohn's disease, unspecified, without complications: K50.90

## 2012-04-07 LAB — CBC
MCH: 30.6 pg (ref 26.0–34.0)
MCHC: 33.3 g/dL (ref 30.0–36.0)
MCV: 91.7 fL (ref 78.0–100.0)
Platelets: 257 10*3/uL (ref 150–400)
RBC: 4.12 MIL/uL (ref 3.87–5.11)
RDW: 13.5 % (ref 11.5–15.5)

## 2012-04-07 LAB — BASIC METABOLIC PANEL
BUN: 24 mg/dL — ABNORMAL HIGH (ref 6–23)
CO2: 22 mEq/L (ref 19–32)
Calcium: 9.3 mg/dL (ref 8.4–10.5)
Creatinine, Ser: 0.91 mg/dL (ref 0.50–1.10)
GFR calc non Af Amer: 69 mL/min — ABNORMAL LOW (ref >90)
Glucose, Bld: 106 mg/dL — ABNORMAL HIGH (ref 70–99)

## 2012-04-07 LAB — PROTIME-INR: Prothrombin Time: 12.8 seconds (ref 11.6–15.2)

## 2012-04-07 MED ORDER — FENTANYL CITRATE 0.05 MG/ML IJ SOLN
INTRAMUSCULAR | Status: AC | PRN
  Administered 2012-04-07 (×5): 50 ug via INTRAVENOUS

## 2012-04-07 MED ORDER — MIDAZOLAM HCL 2 MG/2ML IJ SOLN
INTRAMUSCULAR | Status: AC
  Filled 2012-04-07: qty 4

## 2012-04-07 MED ORDER — SODIUM CHLORIDE 0.9 % IV SOLN: Freq: Once | INTRAVENOUS | Status: DC

## 2012-04-07 MED ORDER — HYDROCODONE-ACETAMINOPHEN 5-325 MG PO TABS
ORAL_TABLET | ORAL | Status: AC
  Filled 2012-04-07: qty 2

## 2012-04-07 MED ORDER — IOHEXOL 300 MG/ML  SOLN
100.0000 mL | Freq: Once | INTRAMUSCULAR | Status: AC | PRN
  Administered 2012-04-07: 60 mL via INTRAVENOUS

## 2012-04-07 MED ORDER — FENTANYL CITRATE 0.05 MG/ML IJ SOLN
INTRAMUSCULAR | Status: AC
  Filled 2012-04-07: qty 2

## 2012-04-07 MED ORDER — FENTANYL CITRATE 0.05 MG/ML IJ SOLN
INTRAMUSCULAR | Status: AC
  Filled 2012-04-07: qty 4

## 2012-04-07 MED ORDER — HYDROCODONE-ACETAMINOPHEN 5-325 MG PO TABS
1.0000 | ORAL_TABLET | ORAL | Status: DC | PRN
  Administered 2012-04-07: 2 via ORAL

## 2012-04-07 MED ORDER — MIDAZOLAM HCL 2 MG/2ML IJ SOLN
INTRAMUSCULAR | Status: AC
  Filled 2012-04-07: qty 6

## 2012-04-07 MED ORDER — SODIUM CHLORIDE 0.9 % IV SOLN
INTRAVENOUS | Status: AC | PRN
  Administered 2012-04-07: 50 mL/h via INTRAVENOUS

## 2012-04-07 MED ORDER — MIDAZOLAM HCL 5 MG/5ML IJ SOLN
INTRAMUSCULAR | Status: AC | PRN
Start: 2012-04-07 — End: 2012-04-07
  Administered 2012-04-07 (×4): 2 mg via INTRAVENOUS

## 2012-04-07 NOTE — ED Notes (Signed)
Right groin pressure dressing clean dry and intact.  Bilateral lower ext pulses x 4 at 3. Complained of pain rated 5 / 10.  Lortab 5/325 given x 2 tabs.

## 2012-04-07 NOTE — H&P (Signed)
Kiara Welch is an 56 y.o. female.   Chief Complaint: Shortness of breath; facial swelling; chest fullness x 1-2 months Pt has hx of Superior vena cava syndrome Previous angioplasty 7/11; stent placed 3/12 Scheduled now for superior vena cavagram with possible angioplasty/stent placement HPI: Crohns disease  Past Medical History  Diagnosis Date  . Shortness of breath   . Crohn disease     Past Surgical History  Procedure Date  . Colon surgery     crohn disease    History reviewed. No pertinent family history. Social History:  reports that she quit smoking about 5 years ago. Her smoking use included Cigarettes. She has a 20 pack-year smoking history. She has never used smokeless tobacco. She reports that she does not drink alcohol or use illicit drugs.  Allergies:  Allergies  Allergen Reactions  . Remicade (Infliximab) Nausea And Vomiting    Medications Prior to Admission  Medication Sig Dispense Refill  . aspirin 325 MG tablet Take 325 mg by mouth daily.      Marland Kitchen octreotide (SANDOSTATIN LAR) 20 MG injection Inject 20 mg into the muscle every 28 (twenty-eight) days.      Marland Kitchen oxyCODONE-acetaminophen (PERCOCET) 7.5-500 MG per tablet Take 1 tablet by mouth 2 (two) times daily as needed. For pain       Medications Prior to Admission  Medication Dose Route Frequency Provider Last Rate Last Dose  . 0.9 %  sodium chloride infusion   Intravenous Once Lavonia Drafts, PA        No results found for this or any previous visit (from the past 48 hour(s)). No results found.  Review of Systems  Constitutional: Negative for fever.  Eyes:       Facial swelling   Respiratory: Positive for cough and shortness of breath.   Gastrointestinal: Positive for abdominal pain. Negative for nausea and vomiting.  Neurological: Positive for headaches.    Blood pressure 132/83, pulse 77, temperature 97 F (36.1 C), temperature source Oral, resp. rate 18, height 5' 5"  (1.651 m), weight 250 lb  (113.399 kg), last menstrual period 04/02/1999, SpO2 97.00%. Physical Exam  Constitutional: She is oriented to person, place, and time. She appears well-developed and well-nourished.  Cardiovascular: Normal rate, regular rhythm and normal heart sounds.   No murmur heard. Respiratory: Effort normal and breath sounds normal. She has no wheezes.  GI: Soft. Bowel sounds are normal. There is no tenderness.       obese  Musculoskeletal: Normal range of motion.  Neurological: She is alert and oriented to person, place, and time. Coordination normal.  Skin: Skin is warm.  Psychiatric: She has a normal mood and affect. Her behavior is normal. Judgment and thought content normal.     Assessment/Plan Hx of SVC syndrome; onset sxs of sob; facial swelling; chest fullness x 1-2 months Scheduled now for SVCgram with possible pta/stent Pt aware of procedure benefits and risks and agreeable to proceed. Consent signed.  Salathiel Ferrara A 04/07/2012, 9:36 AM

## 2012-04-07 NOTE — Discharge Instructions (Signed)
Groin Site Care Refer to this sheet in the next few weeks. These instructions provide you with information on caring for yourself after your procedure. Your caregiver may also give you more specific instructions. Your treatment has been planned according to current medical practices, but problems sometimes occur. Call your caregiver if you have any problems or questions after your procedure. HOME CARE INSTRUCTIONS  You may shower 24 hours after the procedure. Remove the bandage (dressing) and gently wash the site with plain soap and water. Gently pat the site dry.   Do not apply powder or lotion to the site.   Do not sit in a bathtub, swimming pool, or whirlpool for 5 to 7 days.   No bending, squatting, or lifting anything over 10 pounds (4.5 kg) as directed by your caregiver.   Inspect the site at least twice daily.   Do not drive home if you are discharged the same day of the procedure. Have someone else drive you.   You may drive 24 hours after the procedure unless otherwise instructed by your caregiver.  What to expect:  Any bruising will usually fade within 1 to 2 weeks.   Blood that collects in the tissue (hematoma) may be painful to the touch. It should usually decrease in size and tenderness within 1 to 2 weeks.  SEEK IMMEDIATE MEDICAL CARE IF:  You have unusual pain at the groin site or down the affected leg.   You have redness, warmth, swelling, or pain at the groin site.   You have drainage (other than a small amount of blood on the dressing).   You have chills.   You have a fever or persistent symptoms for more than 72 hours.   You have a fever and your symptoms suddenly get worse.   Your leg becomes pale, cool, tingly, or numb.   You have heavy bleeding from the site. Hold pressure on the site.  Document Released: 01/11/2011 Document Revised: 11/28/2011 Document Reviewed: 01/11/2011 [facility] Patient Information 2012 [place]. Groin Site Care Refer to this  sheet in the next few weeks. These instructions provide you with information on caring for yourself after your procedure. Your caregiver may also give you more specific instructions. Your treatment has been planned according to current medical practices, but problems sometimes occur. Call your caregiver if you have any problems or questions after your procedure. HOME CARE INSTRUCTIONS  You may shower 24 hours after the procedure. Remove the bandage (dressing) and gently wash the site with plain soap and water. Gently pat the site dry.   Do not apply powder or lotion to the site.   Do not sit in a bathtub, swimming pool, or whirlpool for 5 to 7 days.   No bending, squatting, or lifting anything over 10 pounds (4.5 kg) as directed by your caregiver.   Inspect the site at least twice daily.   Do not drive home if you are discharged the same day of the procedure. Have someone else drive you.   You may drive 24 hours after the procedure unless otherwise instructed by your caregiver.  What to expect:  Any bruising will usually fade within 1 to 2 weeks.   Blood that collects in the tissue (hematoma) may be painful to the touch. It should usually decrease in size and tenderness within 1 to 2 weeks.  SEEK IMMEDIATE MEDICAL CARE IF:  You have unusual pain at the groin site or down the affected leg.   You have redness, warmth,  swelling, or pain at the groin site.   You have drainage (other than a small amount of blood on the dressing).   You have chills.   You have a fever or persistent symptoms for more than 72 hours.   You have a fever and your symptoms suddenly get worse.   Your leg becomes pale, cool, tingly, or numb.   You have heavy bleeding from the site. Hold pressure on the site.  Document Released: 01/11/2011 Document Revised: 11/28/2011 Document Reviewed: 01/11/2011 [facility] Patient Information 2012 [place].

## 2012-04-07 NOTE — Procedures (Signed)
Procedure:  SVC venogram and SVC angioplasty Findings:  Recurrent stenosis in mid portion of SVC stent of about 60%.  Treated with 12 mm and 14 mm angioplasty with good result.

## 2012-04-07 NOTE — H&P (Signed)
Agree 

## 2012-04-08 ENCOUNTER — Telehealth (HOSPITAL_COMMUNITY): Payer: Self-pay

## 2012-04-20 ENCOUNTER — Encounter (INDEPENDENT_AMBULATORY_CARE_PROVIDER_SITE_OTHER): Payer: Self-pay | Admitting: Internal Medicine

## 2012-04-20 ENCOUNTER — Ambulatory Visit (INDEPENDENT_AMBULATORY_CARE_PROVIDER_SITE_OTHER): Payer: Medicare Other | Admitting: Internal Medicine

## 2012-04-20 VITALS — BP 110/80 | HR 80 | Temp 98.1°F | Ht 66.0 in | Wt 258.6 lb

## 2012-04-20 DIAGNOSIS — S065X9A Traumatic subdural hemorrhage with loss of consciousness of unspecified duration, initial encounter: Secondary | ICD-10-CM | POA: Insufficient documentation

## 2012-04-20 DIAGNOSIS — S065XAA Traumatic subdural hemorrhage with loss of consciousness status unknown, initial encounter: Secondary | ICD-10-CM | POA: Insufficient documentation

## 2012-04-20 DIAGNOSIS — K5090 Crohn's disease, unspecified, without complications: Secondary | ICD-10-CM

## 2012-04-20 NOTE — Progress Notes (Addendum)
Subjective:     Patient ID: Kiara Welch, female   DOB: 12-09-1956, 56 y.o.   MRN: [id number]  HPI Kiara Welch is here for f/u up of her Crohn's. She has a complicated hx with Crohn's.  She tells me she has a stent in her superior vena cava which was placed last March for a 100% occulison.  She had an angioplasty in April of this year for blockage.  Appetite is good. There has been no weight loss. She has actually gained 10-20 pounds since last year.  She c/o mid abdominal pain which she has all the time.  She has been told not to lift anything over the weight of a gallon of milk.  She empties her ostomy bag about 10 times a day.  Kiara Welch sees a trace of blood.   Colonoscopy 05/27/2011 Dr. 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 Narrowing involving distal descending colon just inside the colostomy with active disease in the form of friable mucosa with erosions and erosion. Active disease involving ileum, ileal mucosa proximal to ileocolonic anastomosis only 15cm were examined. Biopsy: Patching chronic active ileitis (small intestine)  Hx of bilateral subdural hematoma while on Coumadin. Review of Systems see hpi Current Outpatient Prescriptions  Medication Sig Dispense Refill  . aspirin 325 MG tablet Take 325 mg by mouth daily.      Marland Kitchen octreotide (SANDOSTATIN LAR) 20 MG injection Inject 20 mg into the muscle every 28 (twenty-eight) days.      Marland Kitchen oxyCODONE-acetaminophen (PERCOCET) 7.5-500 MG per tablet Take 1 tablet by mouth 2 (two) times daily as needed. For pain       Past Surgical History  Procedure Date  . Colon surgery     crohn disease  . Colon surgery     ostomy revision. Fistula repaired   History   Social History  . Marital Status: Legally Separated    Spouse Name: N/A    Number of Children: N/A  . Years of Education: N/A   Occupational History  . Not on file.   Social History Main Topics  . Smoking status: Former Smoker -- 0.5 packs/day for  40 years    Types: Cigarettes    Quit date: 04/02/2007  . Smokeless tobacco: Never Used  . Alcohol Use: No     stopped 1997  . Drug Use: No  . Sexually Active: Not on file   Other Topics Concern  . Not on file   Social History Narrative  . No narrative on file   Family Status  Relation Status Death Age  . Mother Alive     COPD  . Father Deceased     CHF  . Sister Alive     good health, colitis  . Brother Alive     One is a diabetic. Two in good health   Allergies  Allergen Reactions  . Remicade (Infliximab) Nausea And Vomiting                                                                                   Objective:   Physical Exam Filed Vitals:   04/20/12 1100  Height: 5' 6"  (1.676 m)  Weight:  258 lb 9.6 oz (117.3 kg)   CBC    Component Value Date/Time   WBC 7.6 04/07/2012 0927   RBC 4.12 04/07/2012 0927   HGB 12.6 04/07/2012 0927   HCT 37.8 04/07/2012 0927   PLT 257 04/07/2012 0927   MCV 91.7 04/07/2012 0927   MCH 30.6 04/07/2012 0927   MCHC 33.3 04/07/2012 0927   RDW 13.5 04/07/2012 0927   LYMPHSABS 1.9 10/31/2007 0545   MONOABS 0.6 10/31/2007 0545   EOSABS 0.3 10/31/2007 0545   BASOSABS 0.1 10/31/2007 0545    CMP     Component Value Date/Time   NA 142 04/07/2012 0927   K 3.8 04/07/2012 0927   CL 107 04/07/2012 0927   CO2 22 04/07/2012 0927   GLUCOSE 106* 04/07/2012 0927   BUN 24* 04/07/2012 0927   CREATININE 0.91 04/07/2012 0927   CALCIUM 9.3 04/07/2012 0927   PROT 6.8 10/30/2007 1700   ALBUMIN 3.1* 10/30/2007 1700   AST 15 10/30/2007 1700   ALT 14 10/30/2007 1700   ALKPHOS 50 10/30/2007 1700   BILITOT 0.4 10/30/2007 1700   GFRNONAA 69* 04/07/2012 0927   GFRAA 80* 04/07/2012 0927    Alert and oriented. Skin warm and dry. Oral mucosa is moist.   . Sclera anicteric, conjunctivae is pink. Thyroid not enlarged. No cervical lymphadenopathy. Lungs clear. Heart regular rate and rhythm.  Abdomen is soft. Bowel sounds are positive. No hepatomegaly. No abdominal masses  felt. Tenderness to her lower abdomen. Multiple scars to abdomen.  No edema to lower extremities. Patient is alert and oriented. Colostomy bag in place.    Assessment:   Crohn's disease active. Patient has been intolerant of medications for her Crohn's.    Plan:    Will check a CBC and Sed rate today. OV 1 year

## 2012-04-20 NOTE — Patient Instructions (Signed)
OV in 1 yr. Will look at disability papers.

## 2012-04-29 ENCOUNTER — Ambulatory Visit
Admission: RE | Admit: 2012-04-29 | Discharge: 2012-04-29 | Disposition: A | Discharge status: Home / Self Care | Payer: Medicare Other | Source: Ambulatory Visit | Attending: Interventional Radiology | Admitting: Interventional Radiology

## 2012-04-29 VITALS — BP 125/71 | HR 75 | Temp 98.5°F | Resp 14

## 2012-04-29 DIAGNOSIS — I871 Compression of vein: Secondary | ICD-10-CM

## 2012-04-29 NOTE — Progress Notes (Signed)
3wk post SVC Stenting.  Pt doing well post procedure states a little better, still has some SOB w/ exertion and some while sitting.  She still elevates head to sleep at night with pillows.

## 2012-09-30 ENCOUNTER — Encounter (INDEPENDENT_AMBULATORY_CARE_PROVIDER_SITE_OTHER): Payer: Self-pay | Admitting: Internal Medicine

## 2012-09-30 ENCOUNTER — Ambulatory Visit (INDEPENDENT_AMBULATORY_CARE_PROVIDER_SITE_OTHER): Payer: Medicare Other | Admitting: Internal Medicine

## 2012-09-30 VITALS — BP 110/72 | HR 84 | Temp 98.0°F | Resp 18 | Ht 66.0 in | Wt 261.4 lb

## 2012-09-30 DIAGNOSIS — L88 Pyoderma gangrenosum: Secondary | ICD-10-CM | POA: Diagnosis not present

## 2012-09-30 DIAGNOSIS — K5090 Crohn's disease, unspecified, without complications: Secondary | ICD-10-CM

## 2012-09-30 MED ORDER — PREDNISONE 10 MG PO TABS: ORAL_TABLET | ORAL | Status: DC

## 2012-09-30 NOTE — Progress Notes (Signed)
Subjective:     Patient ID: Kiara Welch, female   DOB: 09/11/1956, 56 y.o.   MRN: [id number]  HPIShe presents today with c/o cellulitis with drainage to her left lower leg. Area to her lower leg developed in August.   She tells me she took po Prednisone in September and the area almost cleared but she ran out.  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 She tells me she had similar area to her rt lower leg 2 yrs ago and was treated with hook works that she received from [place].  She tells me she is starting treatment again with hookworms for her Crohns.   . She has a complicated hx with Crohn's. She tells me she has a stent in her superior vena cava which was placed last March for a 100% occulison. She had an angioplasty in April of this year for blockage.  Colonoscopy 05/27/2011 Dr. 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 Narrowing involving distal descending colon just inside the colostomy with active disease in the form of friable mucosa with erosions and erosion. Active disease involving ileum, ileal mucosa proximal to ileocolonic anastomosis only 15cm were examined. Biopsy: Patching chronic active ileitis (small intestine)     Review of Systems see hpi Current Outpatient Prescriptions  Medication Sig Dispense Refill  . aspirin 325 MG tablet Take 325 mg by mouth daily.      Marland Kitchen octreotide (SANDOSTATIN LAR) 20 MG injection Inject 20 mg into the muscle every 28 (twenty-eight) days.      Marland Kitchen oxyCODONE-acetaminophen (PERCOCET) 7.5-500 MG per tablet Take 1 tablet by mouth 2 (two) times daily as needed. For pain       Past Medical History  Diagnosis Date  . Shortness of breath   . Crohn disease    Past Surgical History  Procedure Date  . Colon surgery     crohn disease  . Colon surgery     ostomy revision. Fistula repaired   Family Status  Relation Status Death Age  . Mother Alive     COPD  . Father Deceased     CHF  . Sister Alive    good health, colitis  . Brother Alive     One is a diabetic. Two in good health   History   Social History  . Marital Status: Legally Separated    Spouse Name: N/A    Number of Children: N/A  . Years of Education: N/A   Occupational History  . Not on file.   Social History Main Topics  . Smoking status: Former Smoker -- 0.5 packs/day for 40 years    Types: Cigarettes    Quit date: 04/02/2007  . Smokeless tobacco: Never Used  . Alcohol Use: No     stopped 1997  . Drug Use: No  . Sexually Active: Not on file   Other Topics Concern  . Not on file   Social History Narrative  . No narrative on file        Objective:   Physical Exam  Filed Vitals:   09/30/12 1418  BP: 110/72  Pulse: 84  Temp: 98 F (36.7 C)  Resp: 18  Height: 5' 6"  (1.676 m)  Weight: 261 lb 6.4 oz (118.57 kg)        Assessment:    Pyroderma ganreosum. I discussed with DR. Rehman.      Plan:     Prednisone 91m x 1 week, and drop by 552meach week till  finished. OV when finished.

## 2012-09-30 NOTE — Patient Instructions (Addendum)
Keep area clean and dry. Take Prednisone as directed.

## 2012-11-16 ENCOUNTER — Other Ambulatory Visit: Payer: Self-pay | Admitting: Interventional Radiology

## 2012-11-16 DIAGNOSIS — I871 Compression of vein: Secondary | ICD-10-CM

## 2012-11-24 ENCOUNTER — Ambulatory Visit
Admission: RE | Admit: 2012-11-24 | Discharge: 2012-11-24 | Disposition: A | Discharge status: Home / Self Care | Payer: Medicare Other | Source: Ambulatory Visit | Attending: Interventional Radiology | Admitting: Interventional Radiology

## 2012-11-24 DIAGNOSIS — R0602 Shortness of breath: Secondary | ICD-10-CM | POA: Diagnosis not present

## 2012-11-24 DIAGNOSIS — M7989 Other specified soft tissue disorders: Secondary | ICD-10-CM | POA: Diagnosis not present

## 2012-11-24 DIAGNOSIS — I871 Compression of vein: Secondary | ICD-10-CM

## 2012-11-24 DIAGNOSIS — R221 Localized swelling, mass and lump, neck: Secondary | ICD-10-CM | POA: Diagnosis not present

## 2012-11-24 DIAGNOSIS — R220 Localized swelling, mass and lump, head: Secondary | ICD-10-CM | POA: Diagnosis not present

## 2012-11-24 NOTE — Progress Notes (Signed)
FOR THE PAST SEVERAL WEEKS, PT C/C OF SOB, PALPITATIONS, BILAT HAND AND FACIAL SWELLING, UNSTEADY GAIT/LIGHTHEADEDNESS, HEADACHES, CANT LAY FLAT, SLEEPING IN RECLINER WITH PILLOWS PROPPED UNDER HEAD AT 30-45 DEGREE ANGLE.

## 2012-11-25 ENCOUNTER — Other Ambulatory Visit (HOSPITAL_COMMUNITY): Payer: Self-pay | Admitting: Interventional Radiology

## 2012-11-25 ENCOUNTER — Encounter (HOSPITAL_COMMUNITY): Payer: Self-pay | Admitting: Pharmacy Technician

## 2012-11-25 ENCOUNTER — Other Ambulatory Visit: Payer: Self-pay | Admitting: Radiology

## 2012-11-25 DIAGNOSIS — I871 Compression of vein: Secondary | ICD-10-CM

## 2012-11-26 ENCOUNTER — Ambulatory Visit (HOSPITAL_COMMUNITY)
Admission: RE | Admit: 2012-11-26 | Discharge: 2012-11-26 | Disposition: A | Discharge status: Home / Self Care | Payer: Medicare Other | Source: Ambulatory Visit | Attending: Interventional Radiology | Admitting: Interventional Radiology

## 2012-11-26 ENCOUNTER — Other Ambulatory Visit (HOSPITAL_COMMUNITY): Payer: Self-pay | Admitting: Interventional Radiology

## 2012-11-26 ENCOUNTER — Encounter (HOSPITAL_COMMUNITY): Payer: Self-pay

## 2012-11-26 ENCOUNTER — Ambulatory Visit (INDEPENDENT_AMBULATORY_CARE_PROVIDER_SITE_OTHER): Payer: Medicare Other | Admitting: Internal Medicine

## 2012-11-26 DIAGNOSIS — T82898A Other specified complication of vascular prosthetic devices, implants and grafts, initial encounter: Secondary | ICD-10-CM | POA: Insufficient documentation

## 2012-11-26 DIAGNOSIS — I871 Compression of vein: Secondary | ICD-10-CM

## 2012-11-26 DIAGNOSIS — Y849 Medical procedure, unspecified as the cause of abnormal reaction of the patient, or of later complication, without mention of misadventure at the time of the procedure: Secondary | ICD-10-CM | POA: Insufficient documentation

## 2012-11-26 DIAGNOSIS — Y92009 Unspecified place in unspecified non-institutional (private) residence as the place of occurrence of the external cause: Secondary | ICD-10-CM | POA: Insufficient documentation

## 2012-11-26 DIAGNOSIS — R0602 Shortness of breath: Secondary | ICD-10-CM | POA: Insufficient documentation

## 2012-11-26 DIAGNOSIS — I82290 Acute embolism and thrombosis of other thoracic veins: Secondary | ICD-10-CM | POA: Diagnosis not present

## 2012-11-26 HISTORY — DX: Compression of vein: I87.1

## 2012-11-26 LAB — PROTIME-INR: Prothrombin Time: 12.1 seconds (ref 11.6–15.2)

## 2012-11-26 LAB — CBC
MCH: 30.9 pg (ref 26.0–34.0)
MCHC: 33.4 g/dL (ref 30.0–36.0)
MCV: 92.3 fL (ref 78.0–100.0)
Platelets: 250 10*3/uL (ref 150–400)

## 2012-11-26 LAB — BASIC METABOLIC PANEL
BUN: 20 mg/dL (ref 6–23)
CO2: 22 mEq/L (ref 19–32)
Calcium: 9.6 mg/dL (ref 8.4–10.5)
GFR calc non Af Amer: 72 mL/min — ABNORMAL LOW (ref >90)
Glucose, Bld: 98 mg/dL (ref 70–99)

## 2012-11-26 MED ORDER — HYDROCODONE-ACETAMINOPHEN 5-325 MG PO TABS: 1.0000 | ORAL_TABLET | ORAL | Status: DC | PRN

## 2012-11-26 MED ORDER — SODIUM CHLORIDE 0.45 % IV SOLN: INTRAVENOUS | Status: AC

## 2012-11-26 MED ORDER — SODIUM CHLORIDE 0.45 % IV SOLN
INTRAVENOUS | Status: DC
  Administered 2012-11-26: 12:00:00 via INTRAVENOUS

## 2012-11-26 MED ORDER — SODIUM CHLORIDE 0.45 % IV SOLN: INTRAVENOUS | Status: DC

## 2012-11-26 MED ORDER — IOHEXOL 300 MG/ML  SOLN
100.0000 mL | Freq: Once | INTRAMUSCULAR | Status: AC | PRN
  Administered 2012-11-26: 50 mL via INTRAVENOUS

## 2012-11-26 MED ORDER — MIDAZOLAM HCL 2 MG/2ML IJ SOLN
INTRAMUSCULAR | Status: AC
  Filled 2012-11-26: qty 4

## 2012-11-26 MED ORDER — MIDAZOLAM HCL 2 MG/2ML IJ SOLN
INTRAMUSCULAR | Status: AC | PRN
  Administered 2012-11-26 (×3): 1 mg via INTRAVENOUS

## 2012-11-26 MED ORDER — HEPARIN SODIUM (PORCINE) 1000 UNIT/ML IJ SOLN
INTRAMUSCULAR | Status: AC
  Filled 2012-11-26: qty 1

## 2012-11-26 MED ORDER — FENTANYL CITRATE 0.05 MG/ML IJ SOLN
INTRAMUSCULAR | Status: AC
  Filled 2012-11-26: qty 4

## 2012-11-26 MED ORDER — FENTANYL CITRATE 0.05 MG/ML IJ SOLN
INTRAMUSCULAR | Status: AC | PRN
  Administered 2012-11-26: 50 ug via INTRAVENOUS
  Administered 2012-11-26: 25 ug via INTRAVENOUS
  Administered 2012-11-26: 50 ug via INTRAVENOUS

## 2012-11-26 NOTE — ED Notes (Signed)
Report given to [facility]

## 2012-11-26 NOTE — Progress Notes (Signed)
Patient ID: Kiara Welch, female   DOB: Oct 02, 1956, 57 y.o.   MRN: [id number]  ESTABLISHED PATIENT OFFICE VISIT  Chief Complaint: Recurrent facial swelling, hand swelling and shortness of breath over the last few weeks.  History: Kiara Welch is status post angioplasty of a critical SVC stenosis in July, 2011 followed by stent placement for recurrent SVC stenosis on 03/21/2011 and angioplasty of intrastent stenosis on 04/07/2012. After all interventions, she noticed significant symptom improvement. She now has recurrent symptoms of SVC syndrome and cannot lie flat. She does have some shortness of breath, but no significant respiratory distress. 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  Review of Systems: Lightheadedness and unsteady gait. Headache with facial swelling. No abdominal pain, nausea or vomiting. No fever or chills.  Exam: Vital signs: Blood pressure 124/64, pulse 91, respirations 20, temperature 98.1, oxygen saturation 98% on room air. Face: Diffuse facial swelling. Extremities: Mild edema of upper extremities and shoulder regions.  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 The patient will require venography with possible angioplasty and/or stent placement via a femoral venous approach. She does not have any current significant respiratory distress to warrant emergent hospitalization. We will schedule venography with intervention as soon as possible on an outpatient basis.

## 2012-11-26 NOTE — H&P (Signed)
Agree 

## 2012-11-26 NOTE — Procedures (Signed)
Procedure:  SVC venogram and balloon angioplasty of SVC Access:  Right CFV, 7 Fr sheath Findings:  Recurrent midstent stenosis in SVC, 60-70%.  Improved after 14 mm angioplasty.

## 2012-11-26 NOTE — H&P (Signed)
Kiara Welch is an 56 y.o. female.   Chief Complaint: pt with hx of superior vena cava syndrome Previous angioplasty/stent July 2011; March 2012 Sx continue to recur sxs now x 1 month: SOB; face and B hand swelling occas CP; tiredness; weak Scheduled now for SVC and upper extremity venogram with possible angioplasty/stent HPI: SVC syndrome; SOB; Crohns  Past Medical History  Diagnosis Date  . Shortness of breath   . Crohn disease   . SVC syndrome     Past Surgical History  Procedure Date  . Colon surgery     crohn disease  . Colon surgery     ostomy revision. Fistula repaired    No family history on file. Social History:  reports that she quit smoking about 5 years ago. Her smoking use included Cigarettes. She has a 20 pack-year smoking history. She has never used smokeless tobacco. She reports that she does not drink alcohol or use illicit drugs.  Allergies:  Allergies  Allergen Reactions  . Remicade (Infliximab) Nausea And Vomiting    Back pain     (Not in a hospital admission)  Results for orders placed during the hospital encounter of 11/26/12 (from the past 48 hour(s))  APTT     Status: Normal   Collection Time   11/26/12 11:30 AM      Component Value Range Comment   aPTT 26  24 - 37 seconds   BASIC METABOLIC PANEL     Status: Abnormal   Collection Time   11/26/12 11:30 AM      Component Value Range Comment   Sodium 141  135 - 145 mEq/L    Potassium 3.8  3.5 - 5.1 mEq/L    Chloride 107  96 - 112 mEq/L    CO2 22  19 - 32 mEq/L    Glucose, Bld 98  70 - 99 mg/dL    BUN 20  6 - 23 mg/dL    Creatinine, Ser 0.88  0.50 - 1.10 mg/dL    Calcium 9.6  8.4 - 10.5 mg/dL    GFR calc non Af Amer 72 (*) >90 mL/min    GFR calc Af Amer 83 (*) >90 mL/min   CBC     Status: Normal   Collection Time   11/26/12 11:30 AM      Component Value Range Comment   WBC 8.5  4.0 - 10.5 K/uL    RBC 4.70  3.87 - 5.11 MIL/uL    Hemoglobin 14.5  12.0 - 15.0 g/dL    HCT 43.4  36.0 - 46.0  %    MCV 92.3  78.0 - 100.0 fL    MCH 30.9  26.0 - 34.0 pg    MCHC 33.4  30.0 - 36.0 g/dL    RDW 13.3  11.5 - 15.5 %    Platelets 250  150 - 400 K/uL   PROTIME-INR     Status: Normal   Collection Time   11/26/12 11:30 AM      Component Value Range Comment   Prothrombin Time 12.1  11.6 - 15.2 seconds    INR 0.90  0.00 - 1.49    [facility] Radiologist Eval & Mgmt  11/25/2012  *RADIOLOGY REPORT*  ESTABLISHED PATIENT OFFICE VISIT - LEVEL III ([id number])  Chief Complaint:  Recurrent facial swelling, hand swelling and shortness of breath over the last few weeks.  History:  Kiara Welch is status post angioplasty of a critical SVC stenosis in July, 2011 followed by stent placement  for recurrent SVC stenosis on 03/21/2011 and angioplasty of intrastent stenosis on 04/07/2012.  After all interventions, she noticed significant symptom improvement.  She now has recurrent symptoms of SVC syndrome and cannot lie flat.  She does have some shortness of breath, but no significant respiratory distress.  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  Review of Systems:  Lightheadedness and unsteady gait.  Headache with facial swelling.  No abdominal pain, nausea or vomiting.  No fever or chills.  Exam:  Vital signs:  Blood pressure 124/64, pulse 91, respirations 20, temperature 98.1, oxygen saturation 98% on room air.  Face: Diffuse facial swelling.  Extremities:  Mild edema of upper extremities and shoulder regions.  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  The patient will require venography with possible angioplasty and/or stent placement via a femoral venous approach.  She does not have any current significant respiratory distress to warrant emergent hospitalization.  We will schedule venography with intervention as soon as possible on an outpatient basis.   Original Report Authenticated By: Aletta Edouard, M.D.      Review of Systems  Constitutional: Negative for fever.  Respiratory: Positive for shortness of breath.   Cardiovascular: Positive for chest pain.  Gastrointestinal: Negative for nausea, vomiting and abdominal pain.  Musculoskeletal:       Face and B hand swelling  Neurological: Positive for weakness.    Blood pressure 123/79, pulse 88, temperature 98.4 F (36.9 C), temperature source Oral, resp. rate 20, height 5' 5.5" (1.664 m), weight 260 lb (117.935 kg), last menstrual period 04/02/1999, SpO2 96.00%. Physical Exam  Constitutional: She is oriented to person, place, and time. She appears well-developed.  Cardiovascular: Normal rate, regular rhythm and normal heart sounds.   No murmur heard. Respiratory: Effort normal and breath sounds normal. She has no wheezes.  GI: Soft. Bowel sounds are normal. There is no rebound.       obese  Musculoskeletal: Normal range of motion. She exhibits edema.       Minimal B hand and arm swelling  Neurological: She is alert and oriented to person, place, and time.  Psychiatric: She has a normal mood and affect. Her behavior is normal. Judgment and thought content normal.     Assessment/Plan Hx SVC syndrome with previous interventions 06/2010; 02/2011 sxs of facial and upper extr swelling recurring now x 1 month Scheduled now for SVC and upper extr venogram with poss pta/stent Pt aware of procedure benefits and risks and agreeable to proceed Consent signed and in chart  [place] A 11/26/2012, 12:52 PM

## 2012-11-26 NOTE — Progress Notes (Signed)
Patient concerned about flushing dye from kidneys due to her colostomy. States that fluids run through very quickly. Requested whole bag of IV fluid. Called Caffie Damme, PA. Pam stated that fluids that are ordered should be sufficient to flush dye out. Patient verbalizes understanding.

## 2012-11-27 ENCOUNTER — Telehealth (HOSPITAL_COMMUNITY): Payer: Self-pay | Admitting: *Deleted

## 2012-11-27 NOTE — Telephone Encounter (Signed)
PT states she is doing well.

## 2012-11-30 ENCOUNTER — Ambulatory Visit (INDEPENDENT_AMBULATORY_CARE_PROVIDER_SITE_OTHER): Payer: Medicare Other | Admitting: Internal Medicine

## 2013-03-29 ENCOUNTER — Telehealth (INDEPENDENT_AMBULATORY_CARE_PROVIDER_SITE_OTHER): Payer: Self-pay | Admitting: *Deleted

## 2013-03-29 NOTE — Telephone Encounter (Signed)
LM asking Kiara Welch to please return her call at [phone]. [name] is staying with her sister for now. She is needing a Rx for a 3 month supply of Sandostatin faxed to the pharmacy and to please call when it arrives. The last shipment was in January 1014 then new eligibly paper work will need to be done in July.

## 2013-03-31 NOTE — Telephone Encounter (Signed)
I called [place] . She was needing her Sandostatin called in or a written prescription. I called [facility] and gave them an refill on the Sandostatin/Bob. Per Mikki Santee the medication will arrive here tomorrow,order # [id number]. Patient called and made aware.

## 2013-04-08 ENCOUNTER — Encounter (INDEPENDENT_AMBULATORY_CARE_PROVIDER_SITE_OTHER): Payer: Self-pay | Admitting: *Deleted

## 2013-04-20 ENCOUNTER — Ambulatory Visit (INDEPENDENT_AMBULATORY_CARE_PROVIDER_SITE_OTHER): Payer: Medicare Other | Admitting: Internal Medicine

## 2013-05-03 ENCOUNTER — Telehealth: Payer: Self-pay | Admitting: Emergency Medicine

## 2013-05-03 NOTE — Telephone Encounter (Signed)
PT LM HERE ON Friday- RETURNED CALL- PT W/ C/C/ OF HEADACHES, VERTIGO, PRESSURE, AND UNSTEADY GAIT FOR 2WEEKS.  HAS SOME FACIAL AND NECK "PUFFINESS".  WILL CONTACT DR Maryan Puls AND GET BACK WITH PT.  DR Maryan Puls CALLED - GET INS APPROVAL TO DO AT [facility], VENOGRAM W/ ANGIOPLASTY FOR SVC STENOSIS AND SVC SYNDROME.   9-:09AM- CALLED JENNIFER AT [facility] TO SET UP FOR THE WEEK OF 05-17-13.  CALLED PT BACK WITH THE ABOVE INFO. AND TO LET HER KNOW JENNIFER W/ [facility] WILL CONTACT HER WITH HER APPT.  Kiara Welch, EMT 05/03/2013 9:26 AM

## 2013-05-12 ENCOUNTER — Other Ambulatory Visit: Payer: Self-pay | Admitting: Radiology

## 2013-05-18 ENCOUNTER — Other Ambulatory Visit (HOSPITAL_COMMUNITY): Payer: Self-pay | Admitting: Interventional Radiology

## 2013-05-18 ENCOUNTER — Ambulatory Visit (HOSPITAL_COMMUNITY)
Admission: RE | Admit: 2013-05-18 | Discharge: 2013-05-18 | Disposition: A | Discharge status: Home / Self Care | Payer: Medicare Other | Source: Ambulatory Visit | Attending: Interventional Radiology | Admitting: Interventional Radiology

## 2013-05-18 DIAGNOSIS — I491 Atrial premature depolarization: Secondary | ICD-10-CM | POA: Insufficient documentation

## 2013-05-18 DIAGNOSIS — K5090 Crohn's disease, unspecified, without complications: Secondary | ICD-10-CM | POA: Diagnosis not present

## 2013-05-18 DIAGNOSIS — Z79899 Other long term (current) drug therapy: Secondary | ICD-10-CM | POA: Insufficient documentation

## 2013-05-18 DIAGNOSIS — Z87891 Personal history of nicotine dependence: Secondary | ICD-10-CM | POA: Insufficient documentation

## 2013-05-18 DIAGNOSIS — I871 Compression of vein: Secondary | ICD-10-CM | POA: Diagnosis not present

## 2013-05-18 DIAGNOSIS — I82290 Acute embolism and thrombosis of other thoracic veins: Secondary | ICD-10-CM | POA: Diagnosis not present

## 2013-05-18 DIAGNOSIS — Z888 Allergy status to other drugs, medicaments and biological substances status: Secondary | ICD-10-CM | POA: Diagnosis not present

## 2013-05-18 LAB — CBC WITH DIFFERENTIAL/PLATELET
Basophils Absolute: 0 10*3/uL (ref 0.0–0.1)
HCT: 38 % (ref 36.0–46.0)
Hemoglobin: 12.7 g/dL (ref 12.0–15.0)
Lymphocytes Relative: 29 % (ref 12–46)
Monocytes Absolute: 0.3 10*3/uL (ref 0.1–1.0)
Monocytes Relative: 4 % (ref 3–12)
Neutro Abs: 4.8 10*3/uL (ref 1.7–7.7)
Neutrophils Relative %: 62 % (ref 43–77)
RDW: 13.9 % (ref 11.5–15.5)
WBC: 7.7 10*3/uL (ref 4.0–10.5)

## 2013-05-18 LAB — PROTIME-INR: INR: 0.89 (ref 0.00–1.49)

## 2013-05-18 LAB — BASIC METABOLIC PANEL
Chloride: 106 mEq/L (ref 96–112)
GFR calc Af Amer: >90 mL/min (ref >90)
Potassium: 4 mEq/L (ref 3.5–5.1)
Sodium: 141 mEq/L (ref 135–145)

## 2013-05-18 LAB — APTT: aPTT: 25 seconds (ref 24–37)

## 2013-05-18 MED ORDER — SODIUM CHLORIDE 0.9 % IV SOLN: INTRAVENOUS | Status: AC

## 2013-05-18 MED ORDER — FENTANYL CITRATE 0.05 MG/ML IJ SOLN
INTRAMUSCULAR | Status: AC | PRN
  Administered 2013-05-18 (×3): 25 ug via INTRAVENOUS
  Administered 2013-05-18 (×2): 50 ug via INTRAVENOUS
  Administered 2013-05-18: 25 ug via INTRAVENOUS

## 2013-05-18 MED ORDER — SODIUM CHLORIDE 0.9 % IV SOLN: Freq: Once | INTRAVENOUS | Status: DC

## 2013-05-18 MED ORDER — IOHEXOL 300 MG/ML  SOLN
100.0000 mL | Freq: Once | INTRAMUSCULAR | Status: AC | PRN
  Administered 2013-05-18: 50 mL via INTRAVENOUS

## 2013-05-18 MED ORDER — MIDAZOLAM HCL 2 MG/2ML IJ SOLN
INTRAMUSCULAR | Status: AC | PRN
  Administered 2013-05-18: 0.5 mg via INTRAVENOUS
  Administered 2013-05-18: 2 mg via INTRAVENOUS
  Administered 2013-05-18: 0.5 mg via INTRAVENOUS

## 2013-05-18 MED ORDER — FENTANYL CITRATE 0.05 MG/ML IJ SOLN
INTRAMUSCULAR | Status: AC
  Filled 2013-05-18: qty 4

## 2013-05-18 MED ORDER — MIDAZOLAM HCL 2 MG/2ML IJ SOLN
INTRAMUSCULAR | Status: AC
  Filled 2013-05-18: qty 4

## 2013-05-18 NOTE — H&P (Signed)
Kiara Welch is an 57 y.o. female.   Chief Complaint: chest/neck fullness HPI: Patient with history of SVC syndrome secondary to chronic stenosis from long term PAC with previous SVC angioplasty 2011, stenting 2012 and angioplasties of intrastent stenoses x2 2013 presents today for SVC venography with possible repeat angioplasty.  Past Medical History  Diagnosis Date  . Shortness of breath   . Crohn disease   . SVC syndrome     Past Surgical History  Procedure Laterality Date  . Colon surgery      crohn disease  . Colon surgery      ostomy revision. Fistula repaired    No family history on file. Social History:  reports that she quit smoking about 6 years ago. Her smoking use included Cigarettes. She has a 20 pack-year smoking history. She has never used smokeless tobacco. She reports that she does not drink alcohol or use illicit drugs.  Allergies:  Allergies  Allergen Reactions  . Remicade (Infliximab) Nausea And Vomiting    Back pain    Current outpatient prescriptions:octreotide (SANDOSTATIN LAR) 20 MG injection, Inject 20 mg into the muscle every 28 (twenty-eight) days. Around end of month-25th or 26th, Disp: , Rfl:  Current facility-administered medications:0.9 %  sodium chloride infusion, , Intravenous, Once, Lavonia Drafts, PA-C   Results for orders placed during the hospital encounter of 05/18/13 (from the past 48 hour(s))  CBC WITH DIFFERENTIAL     Status: None   Collection Time    05/18/13 11:10 AM      Result Value Range   WBC 7.7  4.0 - 10.5 K/uL   RBC 4.04  3.87 - 5.11 MIL/uL   Hemoglobin 12.7  12.0 - 15.0 g/dL   HCT 38.0  36.0 - 46.0 %   MCV 94.1  78.0 - 100.0 fL   MCH 31.4  26.0 - 34.0 pg   MCHC 33.4  30.0 - 36.0 g/dL   RDW 13.9  11.5 - 15.5 %   Platelets 210  150 - 400 K/uL   Neutrophils Relative % 62  43 - 77 %   Neutro Abs 4.8  1.7 - 7.7 K/uL   Lymphocytes Relative 29  12 - 46 %   Lymphs Abs 2.3  0.7 - 4.0 K/uL   Monocytes Relative 4  3 - 12 %    Monocytes Absolute 0.3  0.1 - 1.0 K/uL   Eosinophils Relative 4  0 - 5 %   Eosinophils Absolute 0.3  0.0 - 0.7 K/uL   Basophils Relative 1  0 - 1 %   Basophils Absolute 0.0  0.0 - 0.1 K/uL   No results found.  Review of Systems  Constitutional: Negative for fever and chills.  HENT:       Occ HA's  Respiratory:       Occ cough, dyspnea  Cardiovascular:       Occ mid chest/neck " fullness"  Gastrointestinal: Positive for abdominal pain. Negative for nausea and vomiting.  Musculoskeletal: Positive for back pain.    Blood pressure 129/79, pulse 79, temperature 98 F (36.7 C), temperature source Oral, resp. rate 16, last menstrual period 04/02/1999. Physical Exam  Constitutional: She is oriented to person, place, and time. She appears well-developed and well-nourished.  Cardiovascular: Normal rate and regular rhythm.   Respiratory: Effort normal and breath sounds normal.  GI: Soft. Bowel sounds are normal.  Intact ostomy  Musculoskeletal: Normal range of motion. She exhibits edema.  Neurological: She is alert and  oriented to person, place, and time.     Assessment/Plan Pt with history of SVC syndrome and prior SVC stenting/angioplasties. Plan is for SVC venography today with possible repeat angioplasty/stenting. Details of above d/w pt with her understanding and consent.  Anhar Mcdermott,D KEVIN 05/18/2013, 11:35 AM

## 2013-05-18 NOTE — Procedures (Signed)
Procedure:  SVC venogram with angioplasty. Access:  Right CFV, 7 Fr sheath Findings:  Recurrent intrastent SVC stenosis of 50% treated with 14 mm angioplasty with good result.

## 2013-05-18 NOTE — ED Notes (Signed)
Short stay notified for bed

## 2013-05-18 NOTE — H&P (Signed)
Agree 

## 2013-07-05 ENCOUNTER — Ambulatory Visit (INDEPENDENT_AMBULATORY_CARE_PROVIDER_SITE_OTHER): Payer: Medicare Other | Admitting: Internal Medicine

## 2013-07-08 ENCOUNTER — Ambulatory Visit (INDEPENDENT_AMBULATORY_CARE_PROVIDER_SITE_OTHER): Payer: Medicare Other | Admitting: Internal Medicine

## 2013-07-14 ENCOUNTER — Encounter (INDEPENDENT_AMBULATORY_CARE_PROVIDER_SITE_OTHER): Payer: Self-pay | Admitting: Internal Medicine

## 2013-07-14 ENCOUNTER — Ambulatory Visit (INDEPENDENT_AMBULATORY_CARE_PROVIDER_SITE_OTHER): Payer: Medicare Other | Admitting: Internal Medicine

## 2013-07-14 VITALS — BP 114/72 | HR 76 | Temp 98.5°F | Ht 66.0 in | Wt 251.9 lb

## 2013-07-14 DIAGNOSIS — K50118 Crohn's disease of large intestine with other complication: Secondary | ICD-10-CM

## 2013-07-14 DIAGNOSIS — K5010 Crohn's disease of large intestine without complications: Secondary | ICD-10-CM

## 2013-07-14 NOTE — Patient Instructions (Addendum)
Continue present medications. OV in 1 yr unless you have problems.

## 2013-07-14 NOTE — Progress Notes (Signed)
Subjective:     Patient ID: Kiara Welch, female   DOB: [birth year], 57 y.o.   MRN: [id number]  HPIHere today for f/u of her Crohn's/  Kiara Welch is a 57 yr old female with a complicated hx of Crohn's disease. She was last seen 09/2012.  She has a stent in her superior vena cava which was placed 2 yrs ago for a 100% occlusion.  She had angioplasty in 2013 for a blockage. She also had another angioplasty in May for a stenosis of SVC. She will be  seeing Dr. Morton Stall at [facility] because of ulcer on her stoma.  She is emptying her colostomy 8-10 a day. She occasionally see blood. Her appetite is good. She has lost 10 pounds since her last visit in October of 2013. Her last major surgery was 4 yrs ago at [facility] where she ended up with a colostomy.  She had drainage from the wound and she had 2 subsequent surgeries by Dr. Cheryll Cockayne in 2011.  She occasionally has mucous from her rectum. She has an ulcer right at the edge of her stoma.  She is not on any medication for her Crohn;s Colonoscopy 05/27/2011 Dr. 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 Narrowing involving distal descending colon just inside the colostomy with active disease in the form of friable mucosa with erosions and erosion. Active disease involving ileum, ileal mucosa proximal to ileocolonic anastomosis only 15cm were examined. Biopsy: Patching chronic active ileitis (small intestine)   CBC    Component Value Date/Time   WBC 7.7 05/18/2013 1110   RBC 4.04 05/18/2013 1110   HGB 12.7 05/18/2013 1110   HCT 38.0 05/18/2013 1110   PLT 210 05/18/2013 1110   MCV 94.1 05/18/2013 1110   MCH 31.4 05/18/2013 1110   MCHC 33.4 05/18/2013 1110   RDW 13.9 05/18/2013 1110   LYMPHSABS 2.3 05/18/2013 1110   MONOABS 0.3 05/18/2013 1110   EOSABS 0.3 05/18/2013 1110   BASOSABS 0.0 05/18/2013 1110    .cmet    Review of Systems Current Outpatient Prescriptions  Medication Sig Dispense Refill  . octreotide (SANDOSTATIN LAR) 20  MG injection Inject 20 mg into the muscle every 28 (twenty-eight) days. Around end of month-25th or 26th       No current facility-administered medications for this visit.   Past Medical History  Diagnosis Date  . Shortness of breath   . Crohn disease   . SVC syndrome    Past Surgical History  Procedure Laterality Date  . Colon surgery      crohn disease  . Colon surgery      ostomy revision. Fistula repaired   Allergies  Allergen Reactions  . Remicade (Infliximab) Nausea And Vomiting    Back pain        Objective:   Physical Exam  Filed Vitals:   07/14/13 1436  BP: 114/72  Pulse: 76  Temp: 98.5 F (36.9 C)  Height: 5' 6"  (1.676 m)  Weight: 251 lb 14.4 oz (114.261 kg)   Alert and oriented. Skin warm and dry. Oral mucosa is moist.   . Sclera anicteric, conjunctivae is pink. Thyroid not enlarged. No cervical lymphadenopathy. Lungs clear. Heart regular rate and rhythm.  Abdomen is soft. Bowel sounds are positive. No hepatomegaly. No abdominal masses felt. No tenderness.  No edema to lower extremities. Colostomy. No open wounds noted. Ulcer apparently is under the colostomy seal.  No ulcers to lower extremities.      Assessment:  Crohn's disease which hopefully is in remission.    Plan:    CRP.  OV in 1 yr unless she has problems.

## 2013-07-15 ENCOUNTER — Ambulatory Visit (INDEPENDENT_AMBULATORY_CARE_PROVIDER_SITE_OTHER): Payer: Medicare Other | Admitting: Internal Medicine

## 2013-09-02 ENCOUNTER — Telehealth (INDEPENDENT_AMBULATORY_CARE_PROVIDER_SITE_OTHER): Payer: Self-pay | Admitting: *Deleted

## 2013-09-02 NOTE — Telephone Encounter (Signed)
Our office rec'd a letter stating that the application they rec'd was incomplete. I called the office and spoke with Vince. He ask that I verify the patient's insurance information with him, this was completed. He ask me if the patient had signed the application as it when faxed , cut off the signature, this was also verified. He ask that if the patient had filed a 2013 tax return , they would need a copy of the first 2 pages, if not, they would need a copy of her disability reward letter and 1 month of her check stubs. I called Kiara Welch's cell number and left a message with this information.

## 2013-09-09 ENCOUNTER — Telehealth (INDEPENDENT_AMBULATORY_CARE_PROVIDER_SITE_OTHER): Payer: Self-pay | Admitting: *Deleted

## 2013-09-09 NOTE — Telephone Encounter (Signed)
Kiara Welch was called and a message was left,asking that someone come by and pick up her Sandostatin LAR,and also that we rec'd a note from [employer] that her application was sent back to her to be completed.

## 2013-11-16 ENCOUNTER — Telehealth: Payer: Self-pay | Admitting: Emergency Medicine

## 2013-11-16 NOTE — Telephone Encounter (Signed)
Pt called to state that she is having the SVC Syndrome Symptoms again. Swelling, etc. Dr Kathlene Cote said we can go ahead and set her up at [facility] for Angio.  I will have Anderson Malta at [facility] to contact pt.

## 2013-11-24 ENCOUNTER — Other Ambulatory Visit (HOSPITAL_COMMUNITY): Payer: Self-pay | Admitting: Interventional Radiology

## 2013-11-24 DIAGNOSIS — I871 Compression of vein: Secondary | ICD-10-CM

## 2013-12-01 ENCOUNTER — Other Ambulatory Visit (HOSPITAL_COMMUNITY): Payer: Self-pay | Admitting: Radiology

## 2013-12-06 ENCOUNTER — Encounter (HOSPITAL_COMMUNITY): Payer: Self-pay

## 2013-12-06 ENCOUNTER — Ambulatory Visit (HOSPITAL_COMMUNITY)
Admission: RE | Admit: 2013-12-06 | Discharge: 2013-12-06 | Disposition: A | Discharge status: Home / Self Care | Payer: Medicare Other | Source: Ambulatory Visit | Attending: Interventional Radiology | Admitting: Interventional Radiology

## 2013-12-06 ENCOUNTER — Other Ambulatory Visit (HOSPITAL_COMMUNITY): Payer: Self-pay | Admitting: Interventional Radiology

## 2013-12-06 DIAGNOSIS — I871 Compression of vein: Secondary | ICD-10-CM

## 2013-12-06 DIAGNOSIS — K5090 Crohn's disease, unspecified, without complications: Secondary | ICD-10-CM | POA: Diagnosis not present

## 2013-12-06 DIAGNOSIS — Z01812 Encounter for preprocedural laboratory examination: Secondary | ICD-10-CM | POA: Diagnosis not present

## 2013-12-06 DIAGNOSIS — R51 Headache: Secondary | ICD-10-CM | POA: Diagnosis not present

## 2013-12-06 DIAGNOSIS — Z79899 Other long term (current) drug therapy: Secondary | ICD-10-CM | POA: Diagnosis not present

## 2013-12-06 DIAGNOSIS — R0602 Shortness of breath: Secondary | ICD-10-CM | POA: Diagnosis not present

## 2013-12-06 LAB — CBC WITH DIFFERENTIAL/PLATELET
Eosinophils Absolute: 0.2 10*3/uL (ref 0.0–0.7)
Eosinophils Relative: 3 % (ref 0–5)
HCT: 41.7 % (ref 36.0–46.0)
Hemoglobin: 13.7 g/dL (ref 12.0–15.0)
Lymphocytes Relative: 35 % (ref 12–46)
Lymphs Abs: 2.6 10*3/uL (ref 0.7–4.0)
MCV: 95 fL (ref 78.0–100.0)
Monocytes Absolute: 0.5 10*3/uL (ref 0.1–1.0)
Monocytes Relative: 6 % (ref 3–12)
Neutro Abs: 4.2 10*3/uL (ref 1.7–7.7)
RDW: 13.8 % (ref 11.5–15.5)
WBC: 7.5 10*3/uL (ref 4.0–10.5)

## 2013-12-06 LAB — BASIC METABOLIC PANEL
CO2: 26 mEq/L (ref 19–32)
Chloride: 106 mEq/L (ref 96–112)
Creatinine, Ser: 0.82 mg/dL (ref 0.50–1.10)
GFR calc Af Amer: 90 mL/min — ABNORMAL LOW (ref >90)
Glucose, Bld: 103 mg/dL — ABNORMAL HIGH (ref 70–99)
Potassium: 3.9 mEq/L (ref 3.5–5.1)

## 2013-12-06 LAB — PROTIME-INR: Prothrombin Time: 12.4 seconds (ref 11.6–15.2)

## 2013-12-06 LAB — APTT: aPTT: 23 seconds — ABNORMAL LOW (ref 24–37)

## 2013-12-06 MED ORDER — SODIUM CHLORIDE 0.9 % IV SOLN
Freq: Once | INTRAVENOUS | Status: AC
  Administered 2013-12-06: 11:00:00 via INTRAVENOUS

## 2013-12-06 MED ORDER — MIDAZOLAM HCL 2 MG/2ML IJ SOLN
INTRAMUSCULAR | Status: AC
  Filled 2013-12-06: qty 4

## 2013-12-06 MED ORDER — FENTANYL CITRATE 0.05 MG/ML IJ SOLN
INTRAMUSCULAR | Status: DC | PRN
  Administered 2013-12-06: 25 ug via INTRAVENOUS
  Administered 2013-12-06: 50 ug via INTRAVENOUS
  Administered 2013-12-06 (×3): 25 ug via INTRAVENOUS

## 2013-12-06 MED ORDER — FENTANYL CITRATE 0.05 MG/ML IJ SOLN
INTRAMUSCULAR | Status: AC
  Filled 2013-12-06: qty 4

## 2013-12-06 MED ORDER — SODIUM CHLORIDE 0.9 % IV SOLN: INTRAVENOUS | Status: DC

## 2013-12-06 MED ORDER — MIDAZOLAM HCL 2 MG/2ML IJ SOLN
INTRAMUSCULAR | Status: DC | PRN
  Administered 2013-12-06: 0.5 mg via INTRAVENOUS
  Administered 2013-12-06: 1 mg via INTRAVENOUS
  Administered 2013-12-06: 2 mg via INTRAVENOUS

## 2013-12-06 MED ORDER — IOHEXOL 300 MG/ML  SOLN
100.0000 mL | Freq: Once | INTRAMUSCULAR | Status: AC | PRN
  Administered 2013-12-06: 25 mL via INTRAVENOUS

## 2013-12-06 NOTE — Procedures (Signed)
Procedure:  SVC venogram with angioplasty Access:  Right CFV, 7 Fr sheath Findings:  Recurrent 40% stenosis mid SVC stent.  Treated with 14 mm balloon angioplasty with good result.  No residual stenosis.

## 2013-12-06 NOTE — H&P (Signed)
Kiara Welch is an 57 y.o. female.   Chief Complaint: Pt with long history superior vena cava syndrome Original [place] A Cath placed secondary poor nutrition with Chrons disease 2010 Removed 2011 after development of SVC syndrome  Treated with coumadin x 1 yr. First SVC angioplasty 06/2010 Stent placed 02/2011 pta of stent; 03/2012; 04/2013 Now with new symptoms of shortness of breath; headache; Rt arm swelling x 2 months Scheduled now for superior vena cavagram with probable angioplasty and/or stent placement  HPI: SVC syndrome; Chron's disease   Past Medical History  Diagnosis Date  . Shortness of breath   . Crohn disease   . SVC syndrome     Past Surgical History  Procedure Laterality Date  . Colon surgery      crohn disease  . Colon surgery      ostomy revision. Fistula repaired    History reviewed. No pertinent family history. Social History:  reports that she quit smoking about 6 years ago. Her smoking use included Cigarettes. She has a 20 pack-year smoking history. She has never used smokeless tobacco. She reports that she does not drink alcohol or use illicit drugs.  Allergies:  Allergies  Allergen Reactions  . Remicade [Infliximab] Nausea And Vomiting    Back pain     (Not in a hospital admission)  No results found for this or any previous visit (from the past 48 hour(s)). No results found.  Review of Systems  Constitutional: Negative for fever and weight loss.  Respiratory: Positive for shortness of breath.   Cardiovascular: Positive for orthopnea. Negative for chest pain.  Gastrointestinal: Negative for nausea and vomiting.  Neurological: Negative for weakness.    Blood pressure 131/74, pulse 73, temperature 98 F (36.7 C), temperature source Oral, resp. rate 18, height 5' 6"  (1.676 m), weight 235 lb (106.595 kg), last menstrual period 04/02/1999, SpO2 100.00%. Physical Exam  Constitutional: She is oriented to person, place, and time. She appears  well-nourished.  Cardiovascular: Normal rate and regular rhythm.   No murmur heard. Respiratory: Effort normal and breath sounds normal.  GI: Soft. Bowel sounds are normal. There is no tenderness.  Musculoskeletal: Normal range of motion.  Minimal Rt arm swelling  Neurological: She is alert and oriented to person, place, and time.  Skin: Skin is warm and dry.  Psychiatric: She has a normal mood and affect. Her behavior is normal. Judgment and thought content normal.     Assessment/Plan Long hx SVC syndrome Rt sided arm swelling; headache; SOB x 2 months Pt now scheduled for SVC venogram with possible angioplasty/stent Pt aware of procedure benefits and risks and agreeable to proceed Consent signed and in chart  Kyrin Gratz A 12/06/2013, 10:38 AM

## 2013-12-06 NOTE — Progress Notes (Signed)
Discharge instructions given per MD order.  Pt denies any discomfort at this time.  Pt to car via wheelchair.

## 2013-12-06 NOTE — H&P (Signed)
Agree.  For venography with possible SVC intervention.

## 2013-12-07 ENCOUNTER — Telehealth (HOSPITAL_COMMUNITY): Payer: Self-pay | Admitting: Radiology

## 2014-03-31 DIAGNOSIS — IMO0002 Reserved for concepts with insufficient information to code with codable children: Secondary | ICD-10-CM | POA: Diagnosis not present

## 2014-04-06 ENCOUNTER — Encounter (INDEPENDENT_AMBULATORY_CARE_PROVIDER_SITE_OTHER): Payer: Self-pay | Admitting: Internal Medicine

## 2014-04-06 ENCOUNTER — Ambulatory Visit (INDEPENDENT_AMBULATORY_CARE_PROVIDER_SITE_OTHER): Payer: Medicare Other | Admitting: Internal Medicine

## 2014-04-06 ENCOUNTER — Other Ambulatory Visit (INDEPENDENT_AMBULATORY_CARE_PROVIDER_SITE_OTHER): Payer: Self-pay | Admitting: Internal Medicine

## 2014-04-06 VITALS — BP 142/86 | HR 76 | Temp 98.3°F | Ht 66.0 in | Wt 248.8 lb

## 2014-04-06 DIAGNOSIS — K5010 Crohn's disease of large intestine without complications: Secondary | ICD-10-CM

## 2014-04-06 NOTE — Patient Instructions (Signed)
OV in 1 yr. If any problems call our office

## 2014-04-06 NOTE — Progress Notes (Signed)
Subjective:     Patient ID: Kiara Welch, female   DOB: March 01, 1956, 58 y.o.   MRN: [id number] HPI Here today for f/u of her Crohn's disease. She was diagnosed at age 52.  She was was last seen in 2013. Hx of superior vena cava syndrome which was placed 4 yrs ago for a 100% occlusion.  She is followed by  Dr.Gregory Waters for ulcers around her stoma. She is emptying her colostomy bag at least 6 times a day and sometimes more. Occasionally see blood. Her appetite is good. No weight loss.   No nausea or vomiting. She occasionally sees blood. Colostomy in 2008 and 2013 revision of stoma. Sees Dr. Morton Stall  She is not on any medication for her Crohn;s  Colonoscopy 05/27/2011 Dr. 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 Narrowing involving distal descending colon just inside the colostomy with active disease in the form of friable mucosa with erosions and erosion. Active disease involving ileum, ileal mucosa proximal to ileocolonic anastomosis only 15cm were examined. Biopsy: Patching chronic active ileitis (small intestine)    CBC    Component Value Date/Time   WBC 7.5 12/06/2013 1039   RBC 4.39 12/06/2013 1039   HGB 13.7 12/06/2013 1039   HCT 41.7 12/06/2013 1039   PLT 212 12/06/2013 1039   MCV 95.0 12/06/2013 1039   MCH 31.2 12/06/2013 1039   MCHC 32.9 12/06/2013 1039   RDW 13.8 12/06/2013 1039   LYMPHSABS 2.6 12/06/2013 1039   MONOABS 0.5 12/06/2013 1039   EOSABS 0.2 12/06/2013 1039   BASOSABS 0.0 12/06/2013 1039       Review of Systems     Past Medical History  Diagnosis Date  . Shortness of breath   . Crohn disease   . SVC syndrome     Past Surgical History  Procedure Laterality Date  . Colon surgery      crohn disease  . Colon surgery      ostomy revision. Fistula repaired    Allergies  Allergen Reactions  . Remicade [Infliximab] Nausea And Vomiting    Back pain    Current Outpatient Prescriptions on File Prior to Visit   Medication Sig Dispense Refill  . octreotide (SANDOSTATIN LAR) 20 MG injection Inject 20 mg into the muscle every 28 (twenty-eight) days. Around end of month-25th or 26th       No current facility-administered medications on file prior to visit.     Objective:   Physical Exam Filed Vitals:   04/06/14 1601  BP: 142/86  Pulse: 76  Temp: 98.3 F (36.8 C)  Height: 5' 6"  (1.676 m)  Weight: 248 lb 12.8 oz (112.855 kg)   Alert and oriented. Skin warm and dry. Oral mucosa is moist.   . Sclera anicteric, conjunctivae is pink. Thyroid not enlarged. No cervical lymphadenopathy. Lungs clear. Heart regular rate and rhythm.  Abdomen is soft. Bowel sounds are positive. No hepatomegaly. No abdominal masses felt.Slight tenderness diffuse. Abdomen is obese. Colostomy bag in place.   No edema to lower extremities.        Assessment:    Crohn's disease. She is intolerant of all Crohn's medications. Probable active Crohn's.     Plan:     OV in one yr.  CRP today.

## 2014-04-07 LAB — C-REACTIVE PROTEIN: CRP: 0.9 mg/dL — ABNORMAL HIGH (ref <0.60)

## 2014-04-11 DIAGNOSIS — Z0289 Encounter for other administrative examinations: Secondary | ICD-10-CM

## 2014-04-18 ENCOUNTER — Telehealth (INDEPENDENT_AMBULATORY_CARE_PROVIDER_SITE_OTHER): Payer: Self-pay | Admitting: Internal Medicine

## 2014-04-18 NOTE — Telephone Encounter (Signed)
Results given to patient

## 2014-05-25 ENCOUNTER — Ambulatory Visit: Payer: Self-pay | Admitting: Family

## 2014-06-28 ENCOUNTER — Other Ambulatory Visit (HOSPITAL_COMMUNITY): Payer: Self-pay | Admitting: Interventional Radiology

## 2014-07-01 ENCOUNTER — Telehealth (HOSPITAL_COMMUNITY): Payer: Self-pay | Admitting: Interventional Radiology

## 2014-07-01 ENCOUNTER — Other Ambulatory Visit (HOSPITAL_COMMUNITY): Payer: Self-pay | Admitting: Interventional Radiology

## 2014-07-01 DIAGNOSIS — M7989 Other specified soft tissue disorders: Secondary | ICD-10-CM

## 2014-07-01 DIAGNOSIS — I871 Compression of vein: Secondary | ICD-10-CM

## 2014-07-01 DIAGNOSIS — R51 Headache: Secondary | ICD-10-CM

## 2014-07-01 DIAGNOSIS — R220 Localized swelling, mass and lump, head: Secondary | ICD-10-CM

## 2014-07-01 DIAGNOSIS — R519 Headache, unspecified: Secondary | ICD-10-CM

## 2014-07-01 DIAGNOSIS — R0602 Shortness of breath: Secondary | ICD-10-CM

## 2014-07-01 NOTE — Telephone Encounter (Signed)
Called pt, left VM for her to call me back so that I could give her the appointment information as well as instructions for her upcoming SVC venogram. JM

## 2014-07-04 ENCOUNTER — Encounter (HOSPITAL_COMMUNITY): Payer: Self-pay | Admitting: Pharmacy Technician

## 2014-07-05 ENCOUNTER — Other Ambulatory Visit: Payer: Self-pay | Admitting: Radiology

## 2014-07-06 ENCOUNTER — Encounter (HOSPITAL_COMMUNITY): Payer: Self-pay

## 2014-07-06 ENCOUNTER — Other Ambulatory Visit (HOSPITAL_COMMUNITY): Payer: Self-pay | Admitting: Interventional Radiology

## 2014-07-06 ENCOUNTER — Ambulatory Visit (HOSPITAL_COMMUNITY)
Admission: RE | Admit: 2014-07-06 | Discharge: 2014-07-06 | Disposition: A | Discharge status: Home / Self Care | Payer: Medicare Other | Source: Ambulatory Visit | Attending: Interventional Radiology | Admitting: Interventional Radiology

## 2014-07-06 DIAGNOSIS — K5090 Crohn's disease, unspecified, without complications: Secondary | ICD-10-CM | POA: Diagnosis not present

## 2014-07-06 DIAGNOSIS — I871 Compression of vein: Secondary | ICD-10-CM

## 2014-07-06 DIAGNOSIS — Z87891 Personal history of nicotine dependence: Secondary | ICD-10-CM | POA: Diagnosis not present

## 2014-07-06 DIAGNOSIS — R0602 Shortness of breath: Secondary | ICD-10-CM

## 2014-07-06 DIAGNOSIS — M7989 Other specified soft tissue disorders: Secondary | ICD-10-CM

## 2014-07-06 DIAGNOSIS — R519 Headache, unspecified: Secondary | ICD-10-CM

## 2014-07-06 DIAGNOSIS — R51 Headache: Secondary | ICD-10-CM

## 2014-07-06 DIAGNOSIS — R220 Localized swelling, mass and lump, head: Secondary | ICD-10-CM

## 2014-07-06 LAB — BASIC METABOLIC PANEL
Anion gap: 17 — ABNORMAL HIGH (ref 5–15)
BUN: 21 mg/dL (ref 6–23)
CALCIUM: 9.4 mg/dL (ref 8.4–10.5)
CO2: 21 mEq/L (ref 19–32)
Chloride: 104 mEq/L (ref 96–112)
Creatinine, Ser: 0.86 mg/dL (ref 0.50–1.10)
GFR calc Af Amer: 85 mL/min — ABNORMAL LOW (ref >90)
GFR, EST NON AFRICAN AMERICAN: 73 mL/min — AB (ref >90)
GLUCOSE: 113 mg/dL — AB (ref 70–99)
Potassium: 4.1 mEq/L (ref 3.7–5.3)
SODIUM: 142 meq/L (ref 137–147)

## 2014-07-06 LAB — CBC WITH DIFFERENTIAL/PLATELET
Basophils Absolute: 0 10*3/uL (ref 0.0–0.1)
Basophils Relative: 0 % (ref 0–1)
EOS ABS: 0.3 10*3/uL (ref 0.0–0.7)
EOS PCT: 3 % (ref 0–5)
HCT: 43.7 % (ref 36.0–46.0)
Hemoglobin: 14.1 g/dL (ref 12.0–15.0)
Lymphocytes Relative: 30 % (ref 12–46)
Lymphs Abs: 2.8 10*3/uL (ref 0.7–4.0)
MCH: 30.4 pg (ref 26.0–34.0)
MCHC: 32.3 g/dL (ref 30.0–36.0)
MCV: 94.2 fL (ref 78.0–100.0)
Monocytes Absolute: 0.6 10*3/uL (ref 0.1–1.0)
Monocytes Relative: 6 % (ref 3–12)
NEUTROS PCT: 61 % (ref 43–77)
Neutro Abs: 5.6 10*3/uL (ref 1.7–7.7)
PLATELETS: 250 10*3/uL (ref 150–400)
RBC: 4.64 MIL/uL (ref 3.87–5.11)
RDW: 13.1 % (ref 11.5–15.5)
WBC: 9.3 10*3/uL (ref 4.0–10.5)

## 2014-07-06 LAB — PROTIME-INR
INR: 0.86 (ref 0.00–1.49)
Prothrombin Time: 11.7 seconds (ref 11.6–15.2)

## 2014-07-06 LAB — APTT: aPTT: 28 seconds (ref 24–37)

## 2014-07-06 MED ORDER — IOHEXOL 300 MG/ML  SOLN
100.0000 mL | Freq: Once | INTRAMUSCULAR | Status: AC | PRN
  Administered 2014-07-06: 30 mL via INTRAVENOUS

## 2014-07-06 MED ORDER — SODIUM CHLORIDE 0.9 % IV SOLN
INTRAVENOUS | Status: DC
  Administered 2014-07-06: 09:00:00 via INTRAVENOUS

## 2014-07-06 MED ORDER — FENTANYL CITRATE 0.05 MG/ML IJ SOLN
INTRAMUSCULAR | Status: AC | PRN
  Administered 2014-07-06 (×2): 50 ug via INTRAVENOUS

## 2014-07-06 MED ORDER — HEPARIN SODIUM (PORCINE) 1000 UNIT/ML IJ SOLN
INTRAMUSCULAR | Status: AC
  Filled 2014-07-06: qty 1

## 2014-07-06 MED ORDER — MIDAZOLAM HCL 2 MG/2ML IJ SOLN
INTRAMUSCULAR | Status: AC
  Filled 2014-07-06: qty 4

## 2014-07-06 MED ORDER — SODIUM CHLORIDE 0.9 % IV SOLN
INTRAVENOUS | Status: DC
  Administered 2014-07-06: 11:00:00 via INTRAVENOUS

## 2014-07-06 MED ORDER — MIDAZOLAM HCL 2 MG/2ML IJ SOLN
INTRAMUSCULAR | Status: AC | PRN
  Administered 2014-07-06: 2 mg via INTRAVENOUS
  Administered 2014-07-06: 1 mg via INTRAVENOUS

## 2014-07-06 MED ORDER — FENTANYL CITRATE 0.05 MG/ML IJ SOLN
INTRAMUSCULAR | Status: AC
  Filled 2014-07-06: qty 4

## 2014-07-06 NOTE — Discharge Instructions (Signed)
Angiogram, Care After Refer to this sheet in the next few weeks. These instructions provide you with information on caring for yourself after your procedure. Your health care provider may also give you more specific instructions. Your treatment has been planned according to current medical practices, but problems sometimes occur. Call your health care provider if you have any problems or questions after your procedure.  WHAT TO EXPECT AFTER THE PROCEDURE After your procedure, it is typical to have the following sensations:  Minor discomfort or tenderness and a small bump at the catheter insertion site. The bump should usually decrease in size and tenderness within 1 to 2 weeks.  Any bruising will usually fade within 2 to 4 weeks. HOME CARE INSTRUCTIONS   You may need to keep taking blood thinners if they were prescribed for you. Only take over-the-counter or prescription medicines for pain, fever, or discomfort as directed by your health care provider.  Do not apply powder or lotion to the site.  Do not sit in a bathtub, swimming pool, or whirlpool for 5 to 7 days.  You may shower 24 hours after the procedure. Remove the bandage (dressing) and gently wash the site with plain soap and water. Gently pat the site dry.  Inspect the site at least twice daily.  Limit your activity for the first 48 hours. Do not bend, squat, or lift anything over 20 lb (9 kg) or as directed by your health care provider.  Do not drive home if you are discharged the day of the procedure. Have someone else drive you. Follow instructions about when you can drive or return to work. SEEK MEDICAL CARE IF:  You get lightheaded when standing up.  You have drainage (other than a small amount of blood on the dressing).  You have chills.  You have a fever.  You have redness, warmth, swelling, or pain at the insertion site. SEEK IMMEDIATE MEDICAL CARE IF:   You develop chest pain or shortness of breath, feel faint,  or pass out.  You have bleeding, swelling larger than a walnut, or drainage from the catheter insertion site.  You develop pain, discoloration, coldness, or severe bruising in the leg or arm that held the catheter.  You have heavy bleeding from the site. If this happens, hold pressure on the site and call 911. MAKE SURE YOU:  Understand these instructions.  Will watch your condition.  Will get help right away if you are not doing well or get worse. Document Released: 06/27/2005 Document Revised: 12/14/2013 Document Reviewed: 05/03/2013 [facility] Patient Information 2015 [place], [place]. This information is not intended to replace advice given to you by your health care provider. Make sure you discuss any questions you have with your health care provider.

## 2014-07-06 NOTE — H&P (Signed)
Recurrent SVC syndrome.  For venography and possible repeat SVC angioplasty today.  Unlikely that new stent will be required.

## 2014-07-06 NOTE — H&P (Signed)
Kiara Welch is an 58 y.o. female.   Chief Complaint: Known Superior Vena Cava syndrome since 2011 First angioplasty/stent was performed 06/2010 after clot developed with [facility] a Cath (access doe Chrons disease)- later removed Since then has had periodic pta in IR Most recent 11/2013 Now has has noticeable Rt hand swelling and SOB with headache x 3 weeks Scheduled now for venogram with probable SVC angioplasty and poss stent.  HPI: Chrons disease; SVC syndrome  Past Medical History  Diagnosis Date  . Shortness of breath   . Crohn disease   . SVC syndrome     Past Surgical History  Procedure Laterality Date  . Colon surgery      crohn disease  . Colon surgery      ostomy revision. Fistula repaired    History reviewed. No pertinent family history. Social History:  reports that she quit smoking about 7 years ago. Her smoking use included Cigarettes. She has a 20 pack-year smoking history. She has never used smokeless tobacco. She reports that she does not drink alcohol or use illicit drugs.  Allergies:  Allergies  Allergen Reactions  . Remicade [Infliximab] Nausea And Vomiting    REACTION: Back pain     (Not in a hospital admission)  Results for orders placed during the hospital encounter of 07/06/14 (from the past 48 hour(s))  CBC WITH DIFFERENTIAL     Status: None   Collection Time    07/06/14  8:50 AM      Result Value Ref Range   WBC 9.3  4.0 - 10.5 K/uL   RBC 4.64  3.87 - 5.11 MIL/uL   Hemoglobin 14.1  12.0 - 15.0 g/dL   HCT 43.7  36.0 - 46.0 %   MCV 94.2  78.0 - 100.0 fL   MCH 30.4  26.0 - 34.0 pg   MCHC 32.3  30.0 - 36.0 g/dL   RDW 13.1  11.5 - 15.5 %   Platelets 250  150 - 400 K/uL   Neutrophils Relative % 61  43 - 77 %   Neutro Abs 5.6  1.7 - 7.7 K/uL   Lymphocytes Relative 30  12 - 46 %   Lymphs Abs 2.8  0.7 - 4.0 K/uL   Monocytes Relative 6  3 - 12 %   Monocytes Absolute 0.6  0.1 - 1.0 K/uL   Eosinophils Relative 3  0 - 5 %   Eosinophils Absolute 0.3   0.0 - 0.7 K/uL   Basophils Relative 0  0 - 1 %   Basophils Absolute 0.0  0.0 - 0.1 K/uL  PROTIME-INR     Status: None   Collection Time    07/06/14  8:50 AM      Result Value Ref Range   Prothrombin Time 11.7  11.6 - 15.2 seconds   INR 0.86  0.00 - 1.49   No results found.  Review of Systems  Constitutional: Negative for fever and weight loss.  Respiratory: Positive for shortness of breath.   Cardiovascular: Negative for chest pain.       Rt hand swollen in am   Gastrointestinal: Negative for nausea, vomiting and abdominal pain.  Musculoskeletal: Negative for neck pain.  Neurological: Positive for headaches. Negative for dizziness and weakness.    Blood pressure 137/72, pulse 82, temperature 97.7 F (36.5 C), temperature source Oral, resp. rate 20, height 5' 6"  (1.676 m), weight 108.41 kg (239 lb), last menstrual period 04/02/1999, SpO2 94.00%. Physical Exam  Constitutional: She is  oriented to person, place, and time. She appears well-nourished.  Cardiovascular: Normal rate and regular rhythm.   No murmur heard. Respiratory: Effort normal and breath sounds normal. She has no wheezes.  GI: Soft. Bowel sounds are normal. There is no tenderness.  Musculoskeletal: Normal range of motion. She exhibits no edema.  Neurological: She is alert and oriented to person, place, and time.  Skin: Skin is warm and dry.  Psychiatric: She has a normal mood and affect. Her behavior is normal. Judgment and thought content normal.     Assessment/Plan SVC syndrome New sxs of ha/sob and Rt hand swelling x 3 weeks Most recent pta in IR 11/2013 Pt now scheduled for venogram with SVC pta/poss stent Pt aware of procedure benefits and risks and agreeable to proceed Consent signed and in chart  Kiara Welch A 07/06/2014, 9:39 AM

## 2014-07-06 NOTE — Procedures (Signed)
Procedure:  SVC venography and angioplasty Findings:  Recurrent SVC stenosis.  Treated with 14 mm angioplasty with improved result.

## 2014-07-18 ENCOUNTER — Encounter (INDEPENDENT_AMBULATORY_CARE_PROVIDER_SITE_OTHER): Payer: Self-pay | Admitting: Internal Medicine

## 2014-07-18 ENCOUNTER — Ambulatory Visit (INDEPENDENT_AMBULATORY_CARE_PROVIDER_SITE_OTHER): Payer: Medicare Other | Admitting: Internal Medicine

## 2014-07-18 VITALS — BP 112/62 | HR 72 | Temp 98.6°F | Ht 66.0 in | Wt 240.1 lb

## 2014-07-18 DIAGNOSIS — K630 Abscess of intestine: Secondary | ICD-10-CM | POA: Diagnosis not present

## 2014-07-18 DIAGNOSIS — R109 Unspecified abdominal pain: Secondary | ICD-10-CM | POA: Insufficient documentation

## 2014-07-18 DIAGNOSIS — K5010 Crohn's disease of large intestine without complications: Secondary | ICD-10-CM

## 2014-07-18 DIAGNOSIS — K50114 Crohn's disease of large intestine with abscess: Secondary | ICD-10-CM

## 2014-07-18 NOTE — Patient Instructions (Signed)
Xray. Further recommendations to follow

## 2014-07-18 NOTE — Progress Notes (Addendum)
Subjective:     Patient ID: Kiara Welch, female   DOB: October 08, 1956, 58 y.o.   MRN: [id number]  HPI Presents today with c/o abdominal pain. She tells me it feels like a virus. She says she is having sharp gas pain. She feels bloated.  She says her stomach is rolling. She tells me if feels like she has an intraabdominal fistula. She has been hurting for about 2-3 weeks.  She has a reddened area to her rt lower leg and left upper leg. Her appetite is okay. When she eats, she says her stomach hurts. She has lost 8 pounds since her last visit in April. She is emptying her colostomy 10 times a day. Stool is very watery.  She denies fever.  She does have some lower back pain   . She was diagnosed at age 46. She was was last seen in 2013. Hx of superior vena cava syndrome which was placed 4 yrs ago for a 100% occlusion.  She is followed by Dr.Gregory Waters for ulcers around her stoma.      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 Sees Dr. Morton Stall  She is not on any medication for her Crohn's. Colonoscopy 05/27/2011 Dr. 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 Narrowing involving distal descending colon just inside the colostomy with active disease in the form of friable mucosa with erosions and erosion. Active disease involving ileum, ileal mucosa proximal to ileocolonic anastomosis only 15cm were examined. Biopsy: Patching chronic active ileitis (small intestine)   CBC    Component Value Date/Time   WBC 9.3 07/06/2014 0850   RBC 4.64 07/06/2014 0850   HGB 14.1 07/06/2014 0850   HCT 43.7 07/06/2014 0850   PLT 250 07/06/2014 0850   MCV 94.2 07/06/2014 0850   MCH 30.4 07/06/2014 0850   MCHC 32.3 07/06/2014 0850   RDW 13.1 07/06/2014 0850   LYMPHSABS 2.8 07/06/2014 0850   MONOABS 0.6 07/06/2014 0850   EOSABS 0.3 07/06/2014 0850   BASOSABS 0.0 07/06/2014 0850    C-Reactive Protein     Component Value Date/Time   CRP 0.9* 04/06/2014 0001       Review of  Systems Past Medical History  Diagnosis Date  . Shortness of breath   . Crohn disease   . SVC syndrome     Past Surgical History  Procedure Laterality Date  . Colon surgery      crohn disease  . Colon surgery      ostomy revision. Fistula repaired  . Angioplasty      Superior vena cava (balloon) 07/06/2014    Allergies  Allergen Reactions  . Remicade [Infliximab] Nausea And Vomiting    REACTION: Back pain    Current Outpatient Prescriptions on File Prior to Visit  Medication Sig Dispense Refill  . octreotide (SANDOSTATIN LAR) 20 MG injection Inject 20 mg into the muscle every 28 (twenty-eight) days. Around end of month-25th or 26th       No current facility-administered medications on file prior to visit.        Objective:   Physical Exam  Filed Vitals:   07/18/14 1138  BP: 112/62  Pulse: 72  Temp: 98.6 F (37 C)  Height: 5' 6"  (1.676 m)  Weight: 240 lb 1.6 oz (108.909 kg)  Alert and oriented. Skin warm and dry. Oral mucosa is moist.   . Sclera anicteric, conjunctivae is pink. Thyroid not enlarged. No cervical lymphadenopathy. Lungs clear. Heart regular rate and rhythm.  Abdomen is soft. Bowel sounds are positive. No hepatomegaly. No abdominal masses felt. Umbilical  Tenderness. Colostomy bag in place.   No edema to lower extremities.  Reddened area to rt lower leg and left left at knee.      Assessment:     Abdominal pain, ? Crohn's flare.        Plan:    CT abdomen/pelvis with CM. Further recommendations to follow. urinalysis

## 2014-07-19 LAB — URINALYSIS
BILIRUBIN URINE: NEGATIVE
Glucose, UA: NEGATIVE mg/dL
HGB URINE DIPSTICK: NEGATIVE
KETONES UR: NEGATIVE mg/dL
Leukocytes, UA: NEGATIVE
Nitrite: NEGATIVE
Protein, ur: NEGATIVE mg/dL
Specific Gravity, Urine: 1.023 (ref 1.005–1.030)
UROBILINOGEN UA: 0.2 mg/dL (ref 0.0–1.0)
pH: 5.5 (ref 5.0–8.0)

## 2014-07-21 ENCOUNTER — Ambulatory Visit (HOSPITAL_COMMUNITY)
Admission: RE | Admit: 2014-07-21 | Discharge: 2014-07-21 | Disposition: A | Discharge status: Home / Self Care | Payer: Medicare Other | Source: Ambulatory Visit | Attending: Internal Medicine | Admitting: Internal Medicine

## 2014-07-21 DIAGNOSIS — R109 Unspecified abdominal pain: Secondary | ICD-10-CM | POA: Diagnosis not present

## 2014-07-21 DIAGNOSIS — Q619 Cystic kidney disease, unspecified: Secondary | ICD-10-CM | POA: Insufficient documentation

## 2014-07-21 DIAGNOSIS — N200 Calculus of kidney: Secondary | ICD-10-CM | POA: Insufficient documentation

## 2014-07-21 DIAGNOSIS — K5090 Crohn's disease, unspecified, without complications: Secondary | ICD-10-CM | POA: Diagnosis not present

## 2014-07-21 DIAGNOSIS — K50114 Crohn's disease of large intestine with abscess: Secondary | ICD-10-CM

## 2014-07-21 DIAGNOSIS — K439 Ventral hernia without obstruction or gangrene: Secondary | ICD-10-CM | POA: Diagnosis not present

## 2014-07-21 DIAGNOSIS — Z933 Colostomy status: Secondary | ICD-10-CM | POA: Diagnosis not present

## 2014-07-21 MED ORDER — IOHEXOL 300 MG/ML  SOLN
100.0000 mL | Freq: Once | INTRAMUSCULAR | Status: AC | PRN
  Administered 2014-07-21: 100 mL via INTRAVENOUS

## 2014-07-26 ENCOUNTER — Telehealth (INDEPENDENT_AMBULATORY_CARE_PROVIDER_SITE_OTHER): Payer: Self-pay | Admitting: Internal Medicine

## 2014-07-26 DIAGNOSIS — K50918 Crohn's disease, unspecified, with other complication: Secondary | ICD-10-CM

## 2014-07-26 MED ORDER — PREDNISONE 10 MG PO TABS: 10.0000 mg | ORAL_TABLET | Freq: Every day | ORAL | Status: DC

## 2014-07-26 NOTE — Telephone Encounter (Signed)
She continues to have red nodules to her lower legs. Am going to start her Prednisone 51m and taper by 5 weekly. She will call with a PR report.

## 2014-07-27 ENCOUNTER — Encounter (INDEPENDENT_AMBULATORY_CARE_PROVIDER_SITE_OTHER): Payer: Self-pay | Admitting: Internal Medicine

## 2014-08-09 ENCOUNTER — Telehealth: Payer: Self-pay | Admitting: Family

## 2014-08-10 NOTE — Telephone Encounter (Signed)
Spoke with pt regarding appt Wanted to be seen for anxiety and pain meds Explained to pt that we don't do chronic pain management

## 2014-10-11 ENCOUNTER — Ambulatory Visit (INDEPENDENT_AMBULATORY_CARE_PROVIDER_SITE_OTHER): Payer: Medicare Other | Admitting: Internal Medicine

## 2014-10-11 ENCOUNTER — Encounter (INDEPENDENT_AMBULATORY_CARE_PROVIDER_SITE_OTHER): Payer: Self-pay | Admitting: Internal Medicine

## 2014-10-11 VITALS — BP 108/70 | HR 84 | Temp 98.0°F | Ht 66.0 in | Wt 233.4 lb

## 2014-10-11 DIAGNOSIS — K50919 Crohn's disease, unspecified, with unspecified complications: Secondary | ICD-10-CM | POA: Diagnosis not present

## 2014-10-11 NOTE — Patient Instructions (Signed)
OV in 2 months.  

## 2014-10-11 NOTE — Progress Notes (Signed)
   Subjective:    Patient ID: Kiara Welch, female    DOB: 1956-07-27, 58 y.o.   MRN: [id number]  HPI Here today for f/u.  She has sore to her left abdomen. She says all her joints hurts. She has decreased appetite. She is emptying her bag about 20 times a day for the past several days. She has some nausea.  Appetite is not good. She has lost 7 pounds since her last visit in July. She has seen a small amt of blood in her colostomy. 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   She was diagnosed at age 1.  Marland Kitchen Hx of superior vena cava syndrome which was placed 4 yrs ago for a 100% occlusion.  She is followed by Dr.Gregory Waters for ulcers around her stoma.  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 Sees Dr. Morton Stall  She is not on any medication for her Crohn's.  Colonoscopy 05/27/2011 Dr. 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 Narrowing involving distal descending colon just inside the colostomy with active disease in the form of friable mucosa with erosions  . Active disease involving ileum, ileal mucosa proximal to ileocolonic anastomosis only 15cm were examined. Biopsy: Patching chronic active ileitis (small intestine)   Review of Systems     Objective:   Physical Exam ; Filed Vitals:   10/11/14 1444  BP: 108/70  Pulse: 84  Temp: 98 F (36.7 C)  Height: 5' 6"  (1.676 m)  Weight: 233 lb 6.4 oz (105.87 kg)  Alert and oriented. Skin warm and dry. Oral mucosa is moist.   . Sclera anicteric, conjunctivae is pink. Thyroid not enlarged. No cervical lymphadenopathy. Lungs clear. Heart regular rate and rhythm.  Abdomen is soft. Colostomy noted. Bag is empty. Bowel sounds are positive. No hepatomegaly. No abdominal masses felt. No tenderness.    . Two lesion to left side of abdomen.         Assessment & Plan:  Crohn's flare. Patient is intolerant of Crohn's medicine. Rx for Prednisone 98m and reduce by 5 each week.  OV in 2 months.

## 2014-12-10 ENCOUNTER — Encounter: Payer: Self-pay | Admitting: Family Medicine

## 2014-12-12 ENCOUNTER — Ambulatory Visit (INDEPENDENT_AMBULATORY_CARE_PROVIDER_SITE_OTHER): Payer: Medicare Other | Admitting: Internal Medicine

## 2014-12-14 ENCOUNTER — Telehealth (INDEPENDENT_AMBULATORY_CARE_PROVIDER_SITE_OTHER): Payer: Self-pay | Admitting: *Deleted

## 2014-12-14 ENCOUNTER — Encounter (INDEPENDENT_AMBULATORY_CARE_PROVIDER_SITE_OTHER): Payer: Self-pay | Admitting: *Deleted

## 2014-12-14 NOTE — Telephone Encounter (Signed)
Kiara Welch NO SHOWED for her apt on 12/12/14 with Deberah Castle, NP. A NS letter has been mailed.

## 2015-02-09 DIAGNOSIS — H43393 Other vitreous opacities, bilateral: Secondary | ICD-10-CM | POA: Diagnosis not present

## 2015-02-20 ENCOUNTER — Encounter (INDEPENDENT_AMBULATORY_CARE_PROVIDER_SITE_OTHER): Payer: Self-pay | Admitting: Internal Medicine

## 2015-02-28 ENCOUNTER — Encounter (INDEPENDENT_AMBULATORY_CARE_PROVIDER_SITE_OTHER): Payer: Self-pay | Admitting: Internal Medicine

## 2015-02-28 ENCOUNTER — Ambulatory Visit (INDEPENDENT_AMBULATORY_CARE_PROVIDER_SITE_OTHER): Payer: Medicare Other | Admitting: Internal Medicine

## 2015-02-28 VITALS — BP 102/68 | HR 84 | Temp 98.2°F | Ht 66.0 in | Wt 251.4 lb

## 2015-02-28 DIAGNOSIS — K50918 Crohn's disease, unspecified, with other complication: Secondary | ICD-10-CM | POA: Diagnosis not present

## 2015-02-28 DIAGNOSIS — K5090 Crohn's disease, unspecified, without complications: Secondary | ICD-10-CM | POA: Diagnosis not present

## 2015-02-28 MED ORDER — PREDNISONE 10 MG PO TABS: 10.0000 mg | ORAL_TABLET | Freq: Every day | ORAL | Status: DC

## 2015-02-28 NOTE — Patient Instructions (Addendum)
CBC, CMET, and CRP. Further recommendations to follow.  Prednisone 57m x 1 weeks and reduce by 526meach week.

## 2015-02-28 NOTE — Progress Notes (Signed)
   Subjective:    Patient ID: Kiara Welch, female    DOB: 10/15/1956, 59 y.o.   MRN: [id number]  HPI Her e today for f/u of her Crohn's colitis.  She took her last Octreotide shot last week.  She says she feels like she is dehydrated.  She has lower abdominal pain. When she eats sometimes she will have a sharp pain. She says her joints are hurting.   She says she has redness to her face. Appetite is good for the most part. She has gained about 20 pounds since her last visit in October. She says she feels bad. There is no nausea or vomiting.  She is trying to eat potatoes and soup. Off Prednisone since November. She is emptying her bag at least 10 times a day.  She c/o being dehydrated. Says her urine is dark in color.   She was diagnosed at age 50. Marland Kitchen Hx of superior vena cava syndrome which was placed 4 yrs ago for a 100% occlusion.  She is followed by Dr.Gregory Waters for ulcers around her stoma.  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 Sees Dr. Morton Stall  She is not on any medication for her Crohn's.  Colonoscopy 05/27/2011 Dr. 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 Narrowing involving distal descending colon just inside the colostomy with active disease in the form of friable mucosa with erosions . Active disease involving ileum, ileal mucosa proximal to ileocolonic anastomosis only 15cm were examined. Biopsy: Patching chronic active ileitis (small intestine)   Review of Systems Past Medical History  Diagnosis Date  . Shortness of breath   . Crohn disease   . SVC syndrome     Past Surgical History  Procedure Laterality Date  . Colon surgery      crohn disease  . Colon surgery      ostomy revision. Fistula repaired  . Angioplasty      Superior vena cava (balloon) 07/06/2014    Allergies  Allergen Reactions  . Remicade [Infliximab] Nausea And Vomiting    REACTION: Back pain    Current Outpatient Prescriptions on File Prior to  Visit  Medication Sig Dispense Refill  . octreotide (SANDOSTATIN LAR) 20 MG injection Inject 20 mg into the muscle every 28 (twenty-eight) days. Around end of month-25th or 26th     No current facility-administered medications on file prior to visit.        Objective:   Physical Exam  Filed Vitals:   02/28/15 1512  Height: 5' 6"  (1.676 m)  Weight: 251 lb 6.4 oz (114.034 kg)   Alert and oriented. Skin warm and dry. Oral mucosa is moist.   . Sclera anicteric, conjunctivae is pink. Thyroid not enlarged. No cervical lymphadenopathy. Lungs clear. Heart regular rate and rhythm.  Abdomen is soft. Bowel sounds are positive. No hepatomegaly. No abdominal masses felt. No tenderness.  No edema to lower extremities.          Assessment & Plan:  Probably UC flare. Am going to start her on Prednisone 58m x 1 week and then taper by 5 each week. CBC, CMET and CRP today.  Further recommendations to follow.  OV in 4 months.

## 2015-03-01 LAB — CBC WITH DIFFERENTIAL/PLATELET
BASOS ABS: 0 10*3/uL (ref 0.0–0.1)
Basophils Relative: 0 % (ref 0–1)
EOS PCT: 4 % (ref 0–5)
Eosinophils Absolute: 0.3 10*3/uL (ref 0.0–0.7)
HEMATOCRIT: 39.2 % (ref 36.0–46.0)
HEMOGLOBIN: 12.4 g/dL (ref 12.0–15.0)
LYMPHS ABS: 2.7 10*3/uL (ref 0.7–4.0)
LYMPHS PCT: 32 % (ref 12–46)
MCH: 28.5 pg (ref 26.0–34.0)
MCHC: 31.6 g/dL (ref 30.0–36.0)
MCV: 90.1 fL (ref 78.0–100.0)
MPV: 10.9 fL (ref 8.6–12.4)
Monocytes Absolute: 0.5 10*3/uL (ref 0.1–1.0)
Monocytes Relative: 6 % (ref 3–12)
Neutro Abs: 4.8 10*3/uL (ref 1.7–7.7)
Neutrophils Relative %: 58 % (ref 43–77)
PLATELETS: 311 10*3/uL (ref 150–400)
RBC: 4.35 MIL/uL (ref 3.87–5.11)
RDW: 13.3 % (ref 11.5–15.5)
WBC: 8.3 10*3/uL (ref 4.0–10.5)

## 2015-03-01 LAB — COMPREHENSIVE METABOLIC PANEL
ALT: 13 U/L (ref 0–35)
AST: 15 U/L (ref 0–37)
Albumin: 3.4 g/dL — ABNORMAL LOW (ref 3.5–5.2)
Alkaline Phosphatase: 59 U/L (ref 39–117)
BUN: 16 mg/dL (ref 6–23)
CO2: 24 meq/L (ref 19–32)
Calcium: 8.9 mg/dL (ref 8.4–10.5)
Chloride: 107 mEq/L (ref 96–112)
Creat: 0.88 mg/dL (ref 0.50–1.10)
GLUCOSE: 91 mg/dL (ref 70–99)
Potassium: 4.6 mEq/L (ref 3.5–5.3)
SODIUM: 141 meq/L (ref 135–145)
Total Bilirubin: 0.5 mg/dL (ref 0.2–1.2)
Total Protein: 6.8 g/dL (ref 6.0–8.3)

## 2015-03-01 LAB — C-REACTIVE PROTEIN: CRP: 7.2 mg/dL — ABNORMAL HIGH (ref <0.60)

## 2015-03-14 ENCOUNTER — Encounter (INDEPENDENT_AMBULATORY_CARE_PROVIDER_SITE_OTHER): Payer: Self-pay | Admitting: Internal Medicine

## 2015-04-05 ENCOUNTER — Other Ambulatory Visit (HOSPITAL_COMMUNITY): Payer: Self-pay | Admitting: Interventional Radiology

## 2015-04-05 DIAGNOSIS — I871 Compression of vein: Secondary | ICD-10-CM

## 2015-04-14 ENCOUNTER — Other Ambulatory Visit: Payer: Self-pay | Admitting: Radiology

## 2015-04-17 ENCOUNTER — Ambulatory Visit (HOSPITAL_COMMUNITY): Admission: RE | Admit: 2015-04-17 | Payer: Medicare Other | Source: Ambulatory Visit

## 2015-04-21 DIAGNOSIS — N200 Calculus of kidney: Secondary | ICD-10-CM | POA: Diagnosis not present

## 2015-04-21 DIAGNOSIS — R109 Unspecified abdominal pain: Secondary | ICD-10-CM | POA: Diagnosis not present

## 2015-04-21 DIAGNOSIS — R1032 Left lower quadrant pain: Secondary | ICD-10-CM | POA: Diagnosis not present

## 2015-05-16 ENCOUNTER — Other Ambulatory Visit: Payer: Self-pay | Admitting: Physician Assistant

## 2015-05-17 ENCOUNTER — Other Ambulatory Visit (HOSPITAL_COMMUNITY): Payer: Self-pay | Admitting: Interventional Radiology

## 2015-05-17 ENCOUNTER — Telehealth (INDEPENDENT_AMBULATORY_CARE_PROVIDER_SITE_OTHER): Payer: Self-pay | Admitting: *Deleted

## 2015-05-17 ENCOUNTER — Encounter (HOSPITAL_COMMUNITY): Payer: Self-pay

## 2015-05-17 ENCOUNTER — Ambulatory Visit (HOSPITAL_COMMUNITY)
Admission: RE | Admit: 2015-05-17 | Discharge: 2015-05-17 | Disposition: A | Discharge status: Home / Self Care | Payer: Medicare Other | Source: Ambulatory Visit | Attending: Interventional Radiology | Admitting: Interventional Radiology

## 2015-05-17 DIAGNOSIS — Z87891 Personal history of nicotine dependence: Secondary | ICD-10-CM | POA: Insufficient documentation

## 2015-05-17 DIAGNOSIS — Z7952 Long term (current) use of systemic steroids: Secondary | ICD-10-CM | POA: Insufficient documentation

## 2015-05-17 DIAGNOSIS — R600 Localized edema: Secondary | ICD-10-CM | POA: Insufficient documentation

## 2015-05-17 DIAGNOSIS — K5090 Crohn's disease, unspecified, without complications: Secondary | ICD-10-CM | POA: Diagnosis not present

## 2015-05-17 DIAGNOSIS — I871 Compression of vein: Secondary | ICD-10-CM

## 2015-05-17 DIAGNOSIS — R0602 Shortness of breath: Secondary | ICD-10-CM | POA: Diagnosis not present

## 2015-05-17 MED ORDER — SODIUM CHLORIDE 0.9 % IV SOLN
INTRAVENOUS | Status: DC
  Administered 2015-05-17: 10:00:00 via INTRAVENOUS

## 2015-05-17 MED ORDER — SODIUM CHLORIDE 0.9 % IV SOLN: INTRAVENOUS | Status: AC

## 2015-05-17 MED ORDER — IOHEXOL 300 MG/ML  SOLN
100.0000 mL | Freq: Once | INTRAMUSCULAR | Status: AC | PRN
  Administered 2015-05-17: 50 mL via INTRAVENOUS

## 2015-05-17 MED ORDER — FENTANYL CITRATE (PF) 100 MCG/2ML IJ SOLN
INTRAMUSCULAR | Status: AC
  Filled 2015-05-17: qty 2

## 2015-05-17 MED ORDER — MIDAZOLAM HCL 2 MG/2ML IJ SOLN
INTRAMUSCULAR | Status: AC | PRN
  Administered 2015-05-17: 1 mg via INTRAVENOUS
  Administered 2015-05-17: 2 mg via INTRAVENOUS
  Administered 2015-05-17: 1 mg via INTRAVENOUS

## 2015-05-17 MED ORDER — MIDAZOLAM HCL 2 MG/2ML IJ SOLN
INTRAMUSCULAR | Status: AC
  Filled 2015-05-17: qty 4

## 2015-05-17 MED ORDER — SODIUM CHLORIDE 0.9 % IV SOLN
INTRAVENOUS | Status: AC | PRN
  Administered 2015-05-17: 1000 mL via INTRAVENOUS

## 2015-05-17 MED ORDER — FENTANYL CITRATE (PF) 100 MCG/2ML IJ SOLN
INTRAMUSCULAR | Status: AC
  Filled 2015-05-17: qty 4

## 2015-05-17 MED ORDER — LIDOCAINE HCL 1 % IJ SOLN
INTRAMUSCULAR | Status: AC
  Filled 2015-05-17: qty 20

## 2015-05-17 MED ORDER — MIDAZOLAM HCL 2 MG/2ML IJ SOLN
INTRAMUSCULAR | Status: AC
  Filled 2015-05-17: qty 2

## 2015-05-17 MED ORDER — FENTANYL CITRATE (PF) 100 MCG/2ML IJ SOLN
INTRAMUSCULAR | Status: AC | PRN
  Administered 2015-05-17 (×2): 50 ug via INTRAVENOUS

## 2015-05-17 NOTE — Telephone Encounter (Signed)
Patient was called and a message was left for patient to come on 05/18/15 to pick up her Sandostatin injections. They arrived in our office today.

## 2015-05-17 NOTE — Sedation Documentation (Signed)
Dr. Kathlene Cote at bedside speaking with the patient.

## 2015-05-17 NOTE — Sedation Documentation (Signed)
Pt moaning in some discomfort w/ balloon inflation, engaging in slow deep breathing for comfort. Stated she is feeling meds as well. Tolerating well.

## 2015-05-17 NOTE — Sedation Documentation (Signed)
Pt requesting IV fluids. Dr. Kathlene Cote ordered 1033m NS to run over 2 hours.

## 2015-05-17 NOTE — Sedation Documentation (Signed)
Called Short Stay to give report. Told RN would have to call back as none available for report at this time.

## 2015-05-17 NOTE — Sedation Documentation (Signed)
Dr. Kathlene Cote told pt he would be doing a plasty and pt asked for more sedation.  Given per Dr. Margaretmary Dys order.

## 2015-05-17 NOTE — Procedures (Signed)
Procedure:  SVC venography and angioplasty Access:  Right common femoral vein Findings:  Recurrent intrastent stenosis in SVC stent of approximately 60-70%.  SVC dilated with 14 mm x 4 cm Atlas balloon. Good result with minimal residual narrowing of <20%. Plan:  2 hr bedrest  Eulas Post T. Kathlene Cote, M.D Pager:  [phone]

## 2015-05-17 NOTE — Sedation Documentation (Signed)
Pt awake but comfortable w/ no c/o pain.

## 2015-05-17 NOTE — Discharge Instructions (Signed)
Venogram, Care After °Refer to this sheet in the next few weeks. These instructions provide you with information on caring for yourself after your procedure. Your health care provider may also give you more specific instructions. Your treatment has been planned according to current medical practices, but problems sometimes occur. Call your health care provider if you have any problems or questions after your procedure. °WHAT TO EXPECT AFTER THE PROCEDURE °After your procedure, it is typical to have the following sensations: °· Mild discomfort at the catheter insertion site. °HOME CARE INSTRUCTIONS  °· Take all medicines exactly as directed. °· Follow any prescribed diet. °· Follow instructions regarding both rest and physical activity. °· Drink more fluids for the first several days after the procedure in order to help flush dye from your kidneys. °SEEK MEDICAL CARE IF: °· You develop a rash. °· You have fever not controlled by medicine. °SEEK IMMEDIATE MEDICAL CARE IF: °· There is pain, drainage, bleeding, redness, swelling, warmth or a red streak at the site of the IV tube. °· The extremity where your IV tube was placed becomes discolored, numb, or cool. °· You have difficulty breathing or shortness of breath. °· You develop chest pain. °· You have excessive dizziness or fainting. °Document Released: 09/29/2013 Document Revised: 12/14/2013 Document Reviewed: 09/29/2013 °[employer]® Patient Information ©2015 [employer]. This information is not intended to replace advice given to you by your health care provider. Make sure you discuss any questions you have with your health care provider. ° °

## 2015-05-17 NOTE — Sedation Documentation (Signed)
Tsosie Billing, PA, holding manual pressure to Rt groin after removal of venous catheter.

## 2015-05-17 NOTE — H&P (Signed)
Chief Complaint: Facial swelling Shortness of breath Upper extremity edema  Sxs x 2 months   Referring Physician(s): Dr Aletta Edouard  History of Present Illness: Kiara Welch is a 59 y.o. female   Pt with known superior vena cava syndrome Periodic angioplasty of stent placed 2012 Pt has had 2 months of symptoms shortness of breath; facial swelling and upper extremity swelling. Now scheduled for recheck venacavagram with possible angioplasty /stent   Past Medical History  Diagnosis Date  . Shortness of breath   . Crohn disease   . SVC syndrome     Past Surgical History  Procedure Laterality Date  . Colon surgery      crohn disease  . Colon surgery      ostomy revision. Fistula repaired  . Angioplasty      Superior vena cava (balloon) 07/06/2014    Allergies: Remicade  Medications: Prior to Admission medications   Medication Sig Start Date End Date Taking? Authorizing Provider  octreotide (SANDOSTATIN LAR) 20 MG injection Inject 20 mg into the muscle every 28 (twenty-eight) days. Around end of month-25th or 26th    Historical Provider, MD  predniSONE (DELTASONE) 10 MG tablet Take 1 tablet (10 mg total) by mouth daily with breakfast. 02/28/15   Butch Penny, NP  predniSONE (DELTASONE) 10 MG tablet Take 1 tablet (10 mg total) by mouth daily with breakfast. Take 96m daily x 1 week, then 367mx 1 week, then 3064m 1 week, then 30m59m1 week, then 25mg37m week, then 20mg 71mweek, then 15mg x87meek, then 10mg x 79mek, then 5 mg x 1 week. 02/28/15   Terri L Butch Penny History reviewed. No pertinent family history.  History   Social History  . Marital Status: Divorced    Spouse Name: N/A  . Number of Children: N/A  . Years of Education: N/A   Social History Main Topics  . Smoking status: Former Smoker -- 0.50 packs/day for 40 years    Types: Cigarettes    Quit date: 04/02/2007  . Smokeless tobacco: Never Used  . Alcohol Use: No     Comment: stopped  1997  . Drug Use: No  . Sexual Activity: Not on file   Other Topics Concern  . None   Social History Narrative     Review of Systems: A 12 point ROS discussed and pertinent positives are indicated in the HPI above.  All other systems are negative.  Review of Systems  Constitutional: Positive for activity change and fatigue.  Respiratory: Positive for shortness of breath. Negative for cough and wheezing.   Gastrointestinal: Negative for abdominal pain and abdominal distention.  Neurological: Positive for dizziness, weakness and light-headedness.  Psychiatric/Behavioral: Negative for behavioral problems and confusion.    Vital Signs: BP 134/90 mmHg  Pulse 102  Temp(Src) 97.5 F (36.4 C)  Resp 18  SpO2 96%  LMP 04/02/1999  Physical Exam  Constitutional: She is oriented to person, place, and time. She appears well-nourished.  Cardiovascular: Normal rate, regular rhythm and normal heart sounds.   No murmur heard. Pulmonary/Chest: Effort normal and breath sounds normal. She has no wheezes.  Abdominal: Soft. Bowel sounds are normal. There is no tenderness.  Musculoskeletal: Normal range of motion.  Neurological: She is alert and oriented to person, place, and time.  Skin: Skin is warm and dry.  Psychiatric: She has a normal mood and affect. Her behavior is normal.  Judgment and thought content normal.  Nursing note and vitals reviewed.   Mallampati Score:  MD Evaluation Airway: WNL Heart: WNL Abdomen: WNL Chest/ Lungs: WNL ASA  Classification: 3 Mallampati/Airway Score: One  Imaging: No results found.  Labs:  CBC:  Recent Labs  07/06/14 0850 02/28/15 1533  WBC 9.3 8.3  HGB 14.1 12.4  HCT 43.7 39.2  PLT 250 311    COAGS:  Recent Labs  07/06/14 0850  INR 0.86  APTT 28    BMP:  Recent Labs  07/06/14 0850 02/28/15 1533  NA 142 141  K 4.1 4.6  CL 104 107  CO2 21 24  GLUCOSE 113* 91  BUN 21 16  CALCIUM 9.4 8.9  CREATININE 0.86 0.88    GFRNONAA 73*  --   GFRAA 85*  --     LIVER FUNCTION TESTS:  Recent Labs  02/28/15 1533  BILITOT 0.5  AST 15  ALT 13  ALKPHOS 59  PROT 6.8  ALBUMIN 3.4*    TUMOR MARKERS: No results for input(s): AFPTM, CEA, CA199, CHROMGRNA in the last 8760 hours.  Assessment and Plan:  SVC syndrome dx 2011 Periodic angioplasty since 2012 placement of stent Pt now with sxs of swelling and sob x 2 months Scheduled now for Venacavagram with possible angioplasty/stent placement Risk and benefits including but not limited to: infection; vessel damage; dye allergy; bleeding; and death. Pt agreeable to proceed Consent signed andin chart  Thank you for this interesting consult.  I greatly enjoyed meeting ATLEE VILLERS and look forward to participating in their care.  Signed: Akaiya Touchette A 05/17/2015, 9:43 AM   I spent a total of  20 Minutes   in face to face in clinical consultation, greater than 50% of which was counseling/coordinating care for venocavagram with poss pta/stent

## 2015-05-17 NOTE — Sedation Documentation (Signed)
Pt stated she could feel the medication and felt more relaxed.  Pt talking with Dr. Kathlene Cote prior to numbing w/ lidocaine.

## 2015-05-23 DIAGNOSIS — M9901 Segmental and somatic dysfunction of cervical region: Secondary | ICD-10-CM | POA: Diagnosis not present

## 2015-05-23 DIAGNOSIS — M5412 Radiculopathy, cervical region: Secondary | ICD-10-CM | POA: Diagnosis not present

## 2015-05-23 DIAGNOSIS — M5032 Other cervical disc degeneration, mid-cervical region: Secondary | ICD-10-CM | POA: Diagnosis not present

## 2015-05-26 DIAGNOSIS — M5020 Other cervical disc displacement, unspecified cervical region: Secondary | ICD-10-CM | POA: Diagnosis not present

## 2015-05-26 DIAGNOSIS — I871 Compression of vein: Secondary | ICD-10-CM | POA: Diagnosis not present

## 2015-05-26 DIAGNOSIS — M5412 Radiculopathy, cervical region: Secondary | ICD-10-CM | POA: Diagnosis not present

## 2015-05-26 DIAGNOSIS — K5090 Crohn's disease, unspecified, without complications: Secondary | ICD-10-CM | POA: Diagnosis not present

## 2015-05-29 DIAGNOSIS — M5412 Radiculopathy, cervical region: Secondary | ICD-10-CM | POA: Diagnosis not present

## 2015-05-29 DIAGNOSIS — M5032 Other cervical disc degeneration, mid-cervical region: Secondary | ICD-10-CM | POA: Diagnosis not present

## 2015-05-29 DIAGNOSIS — M9901 Segmental and somatic dysfunction of cervical region: Secondary | ICD-10-CM | POA: Diagnosis not present

## 2015-05-31 DIAGNOSIS — M5412 Radiculopathy, cervical region: Secondary | ICD-10-CM | POA: Diagnosis not present

## 2015-05-31 DIAGNOSIS — M9901 Segmental and somatic dysfunction of cervical region: Secondary | ICD-10-CM | POA: Diagnosis not present

## 2015-05-31 DIAGNOSIS — M5032 Other cervical disc degeneration, mid-cervical region: Secondary | ICD-10-CM | POA: Diagnosis not present

## 2015-06-02 DIAGNOSIS — M5412 Radiculopathy, cervical region: Secondary | ICD-10-CM | POA: Diagnosis not present

## 2015-06-02 DIAGNOSIS — K5090 Crohn's disease, unspecified, without complications: Secondary | ICD-10-CM | POA: Diagnosis not present

## 2015-06-02 DIAGNOSIS — M542 Cervicalgia: Secondary | ICD-10-CM | POA: Diagnosis not present

## 2015-06-02 DIAGNOSIS — M1990 Unspecified osteoarthritis, unspecified site: Secondary | ICD-10-CM | POA: Diagnosis not present

## 2015-06-02 DIAGNOSIS — I871 Compression of vein: Secondary | ICD-10-CM | POA: Diagnosis not present

## 2015-06-05 DIAGNOSIS — M9901 Segmental and somatic dysfunction of cervical region: Secondary | ICD-10-CM | POA: Diagnosis not present

## 2015-06-05 DIAGNOSIS — M5412 Radiculopathy, cervical region: Secondary | ICD-10-CM | POA: Diagnosis not present

## 2015-06-05 DIAGNOSIS — M5032 Other cervical disc degeneration, mid-cervical region: Secondary | ICD-10-CM | POA: Diagnosis not present

## 2015-06-07 DIAGNOSIS — M5032 Other cervical disc degeneration, mid-cervical region: Secondary | ICD-10-CM | POA: Diagnosis not present

## 2015-06-07 DIAGNOSIS — M5412 Radiculopathy, cervical region: Secondary | ICD-10-CM | POA: Diagnosis not present

## 2015-06-07 DIAGNOSIS — M9902 Segmental and somatic dysfunction of thoracic region: Secondary | ICD-10-CM | POA: Diagnosis not present

## 2015-06-07 DIAGNOSIS — M9901 Segmental and somatic dysfunction of cervical region: Secondary | ICD-10-CM | POA: Diagnosis not present

## 2015-06-08 DIAGNOSIS — M5412 Radiculopathy, cervical region: Secondary | ICD-10-CM | POA: Diagnosis not present

## 2015-06-08 DIAGNOSIS — M5032 Other cervical disc degeneration, mid-cervical region: Secondary | ICD-10-CM | POA: Diagnosis not present

## 2015-06-08 DIAGNOSIS — M9902 Segmental and somatic dysfunction of thoracic region: Secondary | ICD-10-CM | POA: Diagnosis not present

## 2015-06-08 DIAGNOSIS — M9901 Segmental and somatic dysfunction of cervical region: Secondary | ICD-10-CM | POA: Diagnosis not present

## 2015-06-12 DIAGNOSIS — M5032 Other cervical disc degeneration, mid-cervical region: Secondary | ICD-10-CM | POA: Diagnosis not present

## 2015-06-12 DIAGNOSIS — M9902 Segmental and somatic dysfunction of thoracic region: Secondary | ICD-10-CM | POA: Diagnosis not present

## 2015-06-12 DIAGNOSIS — M9901 Segmental and somatic dysfunction of cervical region: Secondary | ICD-10-CM | POA: Diagnosis not present

## 2015-06-12 DIAGNOSIS — M5412 Radiculopathy, cervical region: Secondary | ICD-10-CM | POA: Diagnosis not present

## 2015-06-14 DIAGNOSIS — M9901 Segmental and somatic dysfunction of cervical region: Secondary | ICD-10-CM | POA: Diagnosis not present

## 2015-06-14 DIAGNOSIS — M9902 Segmental and somatic dysfunction of thoracic region: Secondary | ICD-10-CM | POA: Diagnosis not present

## 2015-06-14 DIAGNOSIS — M5032 Other cervical disc degeneration, mid-cervical region: Secondary | ICD-10-CM | POA: Diagnosis not present

## 2015-06-14 DIAGNOSIS — M5412 Radiculopathy, cervical region: Secondary | ICD-10-CM | POA: Diagnosis not present

## 2015-06-27 ENCOUNTER — Ambulatory Visit (INDEPENDENT_AMBULATORY_CARE_PROVIDER_SITE_OTHER): Payer: Medicare Other | Admitting: Internal Medicine

## 2015-06-27 ENCOUNTER — Encounter (INDEPENDENT_AMBULATORY_CARE_PROVIDER_SITE_OTHER): Payer: Self-pay | Admitting: Internal Medicine

## 2015-06-30 DIAGNOSIS — N202 Calculus of kidney with calculus of ureter: Secondary | ICD-10-CM | POA: Diagnosis not present

## 2015-06-30 DIAGNOSIS — N201 Calculus of ureter: Secondary | ICD-10-CM | POA: Diagnosis not present

## 2015-06-30 DIAGNOSIS — Z888 Allergy status to other drugs, medicaments and biological substances status: Secondary | ICD-10-CM | POA: Diagnosis not present

## 2015-06-30 DIAGNOSIS — R109 Unspecified abdominal pain: Secondary | ICD-10-CM | POA: Diagnosis not present

## 2015-07-17 ENCOUNTER — Encounter (INDEPENDENT_AMBULATORY_CARE_PROVIDER_SITE_OTHER): Payer: Self-pay | Admitting: Internal Medicine

## 2015-07-26 ENCOUNTER — Encounter (INDEPENDENT_AMBULATORY_CARE_PROVIDER_SITE_OTHER): Payer: Self-pay | Admitting: *Deleted

## 2015-07-26 ENCOUNTER — Encounter (INDEPENDENT_AMBULATORY_CARE_PROVIDER_SITE_OTHER): Payer: Self-pay | Admitting: Internal Medicine

## 2015-07-26 ENCOUNTER — Ambulatory Visit (INDEPENDENT_AMBULATORY_CARE_PROVIDER_SITE_OTHER): Payer: Medicare Other | Admitting: Internal Medicine

## 2015-07-26 VITALS — BP 102/62 | HR 72 | Temp 98.2°F | Ht 66.0 in | Wt 251.0 lb

## 2015-07-26 DIAGNOSIS — K51218 Ulcerative (chronic) proctitis with other complication: Secondary | ICD-10-CM

## 2015-07-26 MED ORDER — PREDNISONE 10 MG PO TABS: 10.0000 mg | ORAL_TABLET | Freq: Every day | ORAL | Status: DC

## 2015-07-26 NOTE — Patient Instructions (Signed)
CBC with diff, CRP.  Will start Prednisone 32m and taper by 5 mg each week. OV in 3 months.

## 2015-07-26 NOTE — Progress Notes (Signed)
   Subjective:    Patient ID: Kiara Welch, female    DOB: 01-16-1956, 59 y.o.   MRN: [id number]  HPI Here today for f/u of her Crohn's.    She was diagnosed at age 3. Marland Kitchen Hx of superior vena cava syndrome. SVC Angioplasty  in May of this year..  She is followed by Dr.Gregory Waters for ulcers around her stoma.  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 Sees Dr. Morton Stall  She is not on any medication for her Crohn's.  She tells me she has had some bleeding in her colostomy. 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 Her appetite is okay. She has not lost any weight. She is emptying her colostomy bag at least 15 times a day. There is no fever. She feels tired. Colonoscopy 05/27/2011 Dr. 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 Narrowing involving distal descending colon just inside the colostomy with active disease in the form of friable mucosa with erosions . Active disease involving ileum, ileal mucosa proximal to ileocolonic anastomosis only 15cm were examined. Biopsy: Patching chronic active ileitis (small intestine)    Review of Systems Past Medical History  Diagnosis Date  . Shortness of breath   . Crohn disease   . SVC syndrome     Past Surgical History  Procedure Laterality Date  . Colon surgery      crohn disease  . Colon surgery      ostomy revision. Fistula repaired  . Angioplasty      Superior vena cava (balloon) 07/06/2014    Allergies  Allergen Reactions  . Remicade [Infliximab] Nausea And Vomiting    REACTION: Back pain    Current Outpatient Prescriptions on File Prior to Visit  Medication Sig Dispense Refill  . octreotide (SANDOSTATIN LAR) 20 MG injection Inject 20 mg into the muscle every 28 (twenty-eight) days. Around end of month-25th or 26th     No current facility-administered medications on file prior to visit.        Objective:   Physical Exam Blood pressure 102/62, pulse 72,  temperature 98.2 F (36.8 C), height 5' 6"  (1.676 m), weight 251 lb (113.853 kg), last menstrual period 04/02/1999.  Alert and oriented. Skin warm and dry. Oral mucosa is moist.   . Sclera anicteric, conjunctivae is pink. Thyroid not enlarged. No cervical lymphadenopathy. Lungs clear. Heart regular rate and rhythm.  Abdomen is soft. Bowel sounds are positive. No hepatomegaly. No abdominal masses felt. No tenderness.  No edema to lower extremities.         Assessment & Plan:  Probable UC flare. CBC, CRP today.  Prednisone 73m x 1 week, then reduce by 5 each week. OV in 3 months.

## 2015-07-27 LAB — CBC WITH DIFFERENTIAL/PLATELET
Basophils Absolute: 0 10*3/uL (ref 0.0–0.1)
Basophils Relative: 0 % (ref 0–1)
EOS PCT: 5 % (ref 0–5)
Eosinophils Absolute: 0.5 10*3/uL (ref 0.0–0.7)
HCT: 37.8 % (ref 36.0–46.0)
Hemoglobin: 12.2 g/dL (ref 12.0–15.0)
Lymphocytes Relative: 28 % (ref 12–46)
Lymphs Abs: 3 10*3/uL (ref 0.7–4.0)
MCH: 28.8 pg (ref 26.0–34.0)
MCHC: 32.3 g/dL (ref 30.0–36.0)
MCV: 89.2 fL (ref 78.0–100.0)
MONO ABS: 0.7 10*3/uL (ref 0.1–1.0)
MPV: 11 fL (ref 8.6–12.4)
Monocytes Relative: 7 % (ref 3–12)
NEUTROS ABS: 6.4 10*3/uL (ref 1.7–7.7)
Neutrophils Relative %: 60 % (ref 43–77)
PLATELETS: 345 10*3/uL (ref 150–400)
RBC: 4.24 MIL/uL (ref 3.87–5.11)
RDW: 13 % (ref 11.5–15.5)
WBC: 10.7 10*3/uL — ABNORMAL HIGH (ref 4.0–10.5)

## 2015-07-27 LAB — C-REACTIVE PROTEIN: CRP: 4.2 mg/dL — AB (ref <0.60)

## 2015-07-31 DIAGNOSIS — M9901 Segmental and somatic dysfunction of cervical region: Secondary | ICD-10-CM | POA: Diagnosis not present

## 2015-07-31 DIAGNOSIS — M5032 Other cervical disc degeneration, mid-cervical region: Secondary | ICD-10-CM | POA: Diagnosis not present

## 2015-07-31 DIAGNOSIS — M5412 Radiculopathy, cervical region: Secondary | ICD-10-CM | POA: Diagnosis not present

## 2015-07-31 DIAGNOSIS — M9902 Segmental and somatic dysfunction of thoracic region: Secondary | ICD-10-CM | POA: Diagnosis not present

## 2015-08-02 DIAGNOSIS — M9901 Segmental and somatic dysfunction of cervical region: Secondary | ICD-10-CM | POA: Diagnosis not present

## 2015-08-02 DIAGNOSIS — M5412 Radiculopathy, cervical region: Secondary | ICD-10-CM | POA: Diagnosis not present

## 2015-08-02 DIAGNOSIS — M5032 Other cervical disc degeneration, mid-cervical region: Secondary | ICD-10-CM | POA: Diagnosis not present

## 2015-08-02 DIAGNOSIS — M9902 Segmental and somatic dysfunction of thoracic region: Secondary | ICD-10-CM | POA: Diagnosis not present

## 2015-08-03 DIAGNOSIS — M5412 Radiculopathy, cervical region: Secondary | ICD-10-CM | POA: Diagnosis not present

## 2015-08-03 DIAGNOSIS — M9902 Segmental and somatic dysfunction of thoracic region: Secondary | ICD-10-CM | POA: Diagnosis not present

## 2015-08-03 DIAGNOSIS — M9901 Segmental and somatic dysfunction of cervical region: Secondary | ICD-10-CM | POA: Diagnosis not present

## 2015-08-03 DIAGNOSIS — M5032 Other cervical disc degeneration, mid-cervical region: Secondary | ICD-10-CM | POA: Diagnosis not present

## 2015-08-07 DIAGNOSIS — M9901 Segmental and somatic dysfunction of cervical region: Secondary | ICD-10-CM | POA: Diagnosis not present

## 2015-08-07 DIAGNOSIS — M5032 Other cervical disc degeneration, mid-cervical region: Secondary | ICD-10-CM | POA: Diagnosis not present

## 2015-08-07 DIAGNOSIS — M5412 Radiculopathy, cervical region: Secondary | ICD-10-CM | POA: Diagnosis not present

## 2015-08-07 DIAGNOSIS — M9902 Segmental and somatic dysfunction of thoracic region: Secondary | ICD-10-CM | POA: Diagnosis not present

## 2015-08-16 DIAGNOSIS — M5412 Radiculopathy, cervical region: Secondary | ICD-10-CM | POA: Diagnosis not present

## 2015-08-16 DIAGNOSIS — M9901 Segmental and somatic dysfunction of cervical region: Secondary | ICD-10-CM | POA: Diagnosis not present

## 2015-08-16 DIAGNOSIS — M5032 Other cervical disc degeneration, mid-cervical region: Secondary | ICD-10-CM | POA: Diagnosis not present

## 2015-08-16 DIAGNOSIS — M9902 Segmental and somatic dysfunction of thoracic region: Secondary | ICD-10-CM | POA: Diagnosis not present

## 2015-08-18 DIAGNOSIS — M5032 Other cervical disc degeneration, mid-cervical region: Secondary | ICD-10-CM | POA: Diagnosis not present

## 2015-08-18 DIAGNOSIS — M9902 Segmental and somatic dysfunction of thoracic region: Secondary | ICD-10-CM | POA: Diagnosis not present

## 2015-08-18 DIAGNOSIS — M9901 Segmental and somatic dysfunction of cervical region: Secondary | ICD-10-CM | POA: Diagnosis not present

## 2015-08-18 DIAGNOSIS — M5412 Radiculopathy, cervical region: Secondary | ICD-10-CM | POA: Diagnosis not present

## 2015-08-31 DIAGNOSIS — M9901 Segmental and somatic dysfunction of cervical region: Secondary | ICD-10-CM | POA: Diagnosis not present

## 2015-08-31 DIAGNOSIS — M5412 Radiculopathy, cervical region: Secondary | ICD-10-CM | POA: Diagnosis not present

## 2015-08-31 DIAGNOSIS — M9902 Segmental and somatic dysfunction of thoracic region: Secondary | ICD-10-CM | POA: Diagnosis not present

## 2015-08-31 DIAGNOSIS — M5032 Other cervical disc degeneration, mid-cervical region: Secondary | ICD-10-CM | POA: Diagnosis not present

## 2015-09-20 ENCOUNTER — Encounter (INDEPENDENT_AMBULATORY_CARE_PROVIDER_SITE_OTHER): Payer: Self-pay | Admitting: Internal Medicine

## 2015-09-20 DIAGNOSIS — M5412 Radiculopathy, cervical region: Secondary | ICD-10-CM | POA: Diagnosis not present

## 2015-09-20 DIAGNOSIS — M9902 Segmental and somatic dysfunction of thoracic region: Secondary | ICD-10-CM | POA: Diagnosis not present

## 2015-09-20 DIAGNOSIS — M9901 Segmental and somatic dysfunction of cervical region: Secondary | ICD-10-CM | POA: Diagnosis not present

## 2015-09-20 DIAGNOSIS — M5032 Other cervical disc degeneration, mid-cervical region: Secondary | ICD-10-CM | POA: Diagnosis not present

## 2015-09-21 ENCOUNTER — Other Ambulatory Visit (INDEPENDENT_AMBULATORY_CARE_PROVIDER_SITE_OTHER): Payer: Self-pay | Admitting: Internal Medicine

## 2015-09-21 DIAGNOSIS — M9902 Segmental and somatic dysfunction of thoracic region: Secondary | ICD-10-CM | POA: Diagnosis not present

## 2015-09-21 DIAGNOSIS — M9901 Segmental and somatic dysfunction of cervical region: Secondary | ICD-10-CM | POA: Diagnosis not present

## 2015-09-21 DIAGNOSIS — M5412 Radiculopathy, cervical region: Secondary | ICD-10-CM | POA: Diagnosis not present

## 2015-09-21 DIAGNOSIS — M5032 Other cervical disc degeneration, mid-cervical region: Secondary | ICD-10-CM | POA: Diagnosis not present

## 2015-09-27 DIAGNOSIS — M9901 Segmental and somatic dysfunction of cervical region: Secondary | ICD-10-CM | POA: Diagnosis not present

## 2015-09-27 DIAGNOSIS — M5412 Radiculopathy, cervical region: Secondary | ICD-10-CM | POA: Diagnosis not present

## 2015-09-27 DIAGNOSIS — M5032 Other cervical disc degeneration, mid-cervical region, unspecified level: Secondary | ICD-10-CM | POA: Diagnosis not present

## 2015-09-27 DIAGNOSIS — M9902 Segmental and somatic dysfunction of thoracic region: Secondary | ICD-10-CM | POA: Diagnosis not present

## 2015-09-28 DIAGNOSIS — M9901 Segmental and somatic dysfunction of cervical region: Secondary | ICD-10-CM | POA: Diagnosis not present

## 2015-09-28 DIAGNOSIS — M5412 Radiculopathy, cervical region: Secondary | ICD-10-CM | POA: Diagnosis not present

## 2015-09-28 DIAGNOSIS — M9902 Segmental and somatic dysfunction of thoracic region: Secondary | ICD-10-CM | POA: Diagnosis not present

## 2015-09-28 DIAGNOSIS — M5032 Other cervical disc degeneration, mid-cervical region, unspecified level: Secondary | ICD-10-CM | POA: Diagnosis not present

## 2015-09-29 IMAGING — US IR [PERSON_NAME]/SVC
1 series · 1 of 1 positions shown · non-contrast
Comparison: none

CLINICAL DATA: Recurrent SVC syndrome with prior stenting and
balloon angioplasty of the SVC. The patient has recurrent symptoms
of facial swelling and shortness of breath.

[Series 1: ir (person_name)/svc · 1 of 1 slices shown]
[im 1/1]
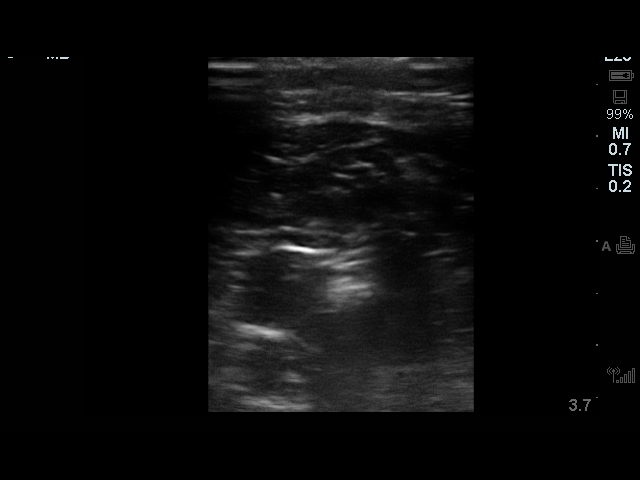

[1 of 1 positions shown; findings below may reference images not displayed]

EXAM:
1. ULTRASOUND GUIDANCE FOR VASCULAR ACCESS OF THE RIGHT COMMON
FEMORAL VEIN
2. SVC VENOGRAPHY
3. VENOUS ANGIOPLASTY OF THE SUPERIOR VENA CAVA

MEDICATIONS:
Sedation:  4.0 mg IV Versed, 100 mcg IV fentanyl.

Total Moderate Sedation Time:  18 minutes.

CONTRAST:  50mL OMNIPAQUE IOHEXOL 300 MG/ML  SOLN

FLUOROSCOPY TIME:  2 minutes and 12 seconds.

PROCEDURE:
Prior to the procedure, informed consent was obtained from the
patient. A time-out was performed prior to the procedure.

The right groin was prepped with Betadine. Maximum barrier sterile
technique was utilized including caps, mask, sterile gowns, sterile
gloves, sterile drape, hand hygiene and skin antiseptic. Local
anesthesia was provided with 1% lidocaine.

Ultrasound was used to confirm patency of the right common femoral
vein. Under ultrasound guidance, access of the vein was performed
with a micropuncture [DATE] French sheath was advanced over a
guidewire.

A 5 French catheter was advanced through venous access and the
inferior vena cava to the level of the right atrium. The catheter
was further advanced through the superior vena cava over a
guidewire. SVC venography was performed through the catheter.

Balloon angioplasty of the SVC was performed with a 14 mm x 4 cm
Atlas balloon. Three separate inflations of the atlas balloon were
performed. The balloon was then removed. Additional venography was
then performed via a 5 French catheter readvanced through the SVC
and to the level of the base of the internal jugular vein.

After the procedure the catheter and sheath were removed and
hemostasis obtained with manual compression.

COMPLICATIONS:
None
FINDINGS: Initial SVC venography demonstrates a recurrent stenosis within the
midportion of a previously stented SVC. Previous stenosis is roughly
60%. During balloon angioplasty, a discrete waist was present on the
balloon at the level of the intra stent stenosis. After balloon
angioplasty, patency of the SVC showed significant improvement with
mild, roughly 20% narrowing remaining.
IMPRESSION: Recurrent moderate SVC stenosis within the midportion of an
indwelling metallic stent. The stenosis was treated with 14 mm
balloon angioplasty with improved result and no significant residual
stenosis remaining.

## 2015-10-04 DIAGNOSIS — M5412 Radiculopathy, cervical region: Secondary | ICD-10-CM | POA: Diagnosis not present

## 2015-10-04 DIAGNOSIS — M5032 Other cervical disc degeneration, mid-cervical region, unspecified level: Secondary | ICD-10-CM | POA: Diagnosis not present

## 2015-10-04 DIAGNOSIS — M9901 Segmental and somatic dysfunction of cervical region: Secondary | ICD-10-CM | POA: Diagnosis not present

## 2015-10-04 DIAGNOSIS — M9902 Segmental and somatic dysfunction of thoracic region: Secondary | ICD-10-CM | POA: Diagnosis not present

## 2015-10-05 DIAGNOSIS — M5412 Radiculopathy, cervical region: Secondary | ICD-10-CM | POA: Diagnosis not present

## 2015-10-05 DIAGNOSIS — M9902 Segmental and somatic dysfunction of thoracic region: Secondary | ICD-10-CM | POA: Diagnosis not present

## 2015-10-05 DIAGNOSIS — M9901 Segmental and somatic dysfunction of cervical region: Secondary | ICD-10-CM | POA: Diagnosis not present

## 2015-10-05 DIAGNOSIS — M5032 Other cervical disc degeneration, mid-cervical region, unspecified level: Secondary | ICD-10-CM | POA: Diagnosis not present

## 2015-10-09 DIAGNOSIS — M9902 Segmental and somatic dysfunction of thoracic region: Secondary | ICD-10-CM | POA: Diagnosis not present

## 2015-10-09 DIAGNOSIS — M9901 Segmental and somatic dysfunction of cervical region: Secondary | ICD-10-CM | POA: Diagnosis not present

## 2015-10-09 DIAGNOSIS — M5032 Other cervical disc degeneration, mid-cervical region, unspecified level: Secondary | ICD-10-CM | POA: Diagnosis not present

## 2015-10-09 DIAGNOSIS — M5412 Radiculopathy, cervical region: Secondary | ICD-10-CM | POA: Diagnosis not present

## 2015-10-26 DIAGNOSIS — M5412 Radiculopathy, cervical region: Secondary | ICD-10-CM | POA: Diagnosis not present

## 2015-10-26 DIAGNOSIS — M9901 Segmental and somatic dysfunction of cervical region: Secondary | ICD-10-CM | POA: Diagnosis not present

## 2015-10-26 DIAGNOSIS — M9902 Segmental and somatic dysfunction of thoracic region: Secondary | ICD-10-CM | POA: Diagnosis not present

## 2015-10-26 DIAGNOSIS — M5032 Other cervical disc degeneration, mid-cervical region, unspecified level: Secondary | ICD-10-CM | POA: Diagnosis not present

## 2015-11-02 DIAGNOSIS — M9902 Segmental and somatic dysfunction of thoracic region: Secondary | ICD-10-CM | POA: Diagnosis not present

## 2015-11-02 DIAGNOSIS — M5412 Radiculopathy, cervical region: Secondary | ICD-10-CM | POA: Diagnosis not present

## 2015-11-02 DIAGNOSIS — M9901 Segmental and somatic dysfunction of cervical region: Secondary | ICD-10-CM | POA: Diagnosis not present

## 2015-11-02 DIAGNOSIS — M5032 Other cervical disc degeneration, mid-cervical region, unspecified level: Secondary | ICD-10-CM | POA: Diagnosis not present

## 2015-11-15 DIAGNOSIS — M5032 Other cervical disc degeneration, mid-cervical region, unspecified level: Secondary | ICD-10-CM | POA: Diagnosis not present

## 2015-11-15 DIAGNOSIS — M9902 Segmental and somatic dysfunction of thoracic region: Secondary | ICD-10-CM | POA: Diagnosis not present

## 2015-11-15 DIAGNOSIS — M9901 Segmental and somatic dysfunction of cervical region: Secondary | ICD-10-CM | POA: Diagnosis not present

## 2015-11-15 DIAGNOSIS — M5412 Radiculopathy, cervical region: Secondary | ICD-10-CM | POA: Diagnosis not present

## 2015-11-22 ENCOUNTER — Ambulatory Visit (INDEPENDENT_AMBULATORY_CARE_PROVIDER_SITE_OTHER): Payer: Medicare Other | Admitting: Internal Medicine

## 2015-11-22 ENCOUNTER — Encounter (INDEPENDENT_AMBULATORY_CARE_PROVIDER_SITE_OTHER): Payer: Self-pay | Admitting: Internal Medicine

## 2015-11-22 VITALS — BP 132/84 | HR 60 | Temp 98.5°F | Ht 66.0 in | Wt 261.0 lb

## 2015-11-22 DIAGNOSIS — K50018 Crohn's disease of small intestine with other complication: Secondary | ICD-10-CM

## 2015-11-22 NOTE — Patient Instructions (Signed)
Prednisone taper starting with 3m and tapering by 5 each week.

## 2015-11-22 NOTE — Progress Notes (Signed)
Subjective:    Patient ID: Kiara Welch, female    DOB: [birth year], 59 y.o.   MRN: [id number]  HPI HPI Here today for f/u of her Crohn's. She was last seen in August for f/u. 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 She is not have any areas of soreness to her extremities now.  This was felt to be a flare of her Crohns. She was started on Prednisone 29m and taper by 523meach week. She finished the Prednisone about 1 week ago. She says she felt fine while on the Prednisone but now she is having some abdominal pain. The pain is intermittent. She is emptying her colostomy bag every 2 hrs. She has seen traced of blood in her bag.  Appetite is good. She has gained 10 pounds since her last visit in August. Intermittent abdominal pain.    She was diagnosed at age 59. Marland Kitchenx of superior vena cava syndrome. SVC Angioplasty in May of this year..  She is followed by Dr.Gregory Waters for ulcers around her stoma.  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 Sees Dr. WaMorton StallShe is not on any medication for her Crohn's.   Colonoscopy 05/27/2011 Dr. 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 Narrowing involving distal descending colon just inside the colostomy with active disease in the form of friable mucosa with erosions . Active disease involving ileum, ileal mucosa proximal to ileocolonic anastomosis only 15cm were examined. Biopsy: Patching chronic active ileitis (small intestine)  07/26/2015 CRP 4.2, CBC    Component Value Date/Time   WBC 10.7* 07/26/2015 1503   RBC 4.24 07/26/2015 1503   HGB 12.2 07/26/2015 1503   HCT 37.8 07/26/2015 1503   PLT 345 07/26/2015 1503   MCV 89.2 07/26/2015 1503   MCH 28.8 07/26/2015 1503   MCHC 32.3 07/26/2015 1503   RDW 13.0 07/26/2015 1503   LYMPHSABS 3.0 07/26/2015 1503   MONOABS 0.7 07/26/2015 1503   EOSABS 0.5 07/26/2015 1503   BASOSABS 0.0 07/26/2015 1503     Review of  Systems Past Medical History  Diagnosis Date  . Shortness of breath   . [place] disease ([place]  . SVC syndrome     Past Surgical History  Procedure Laterality Date  . Colon surgery      crohn disease  . Colon surgery      ostomy revision. Fistula repaired  . Angioplasty      Superior vena cava (balloon) 07/06/2014    Allergies  Allergen Reactions  . Remicade [Infliximab] Nausea And Vomiting    REACTION: Back pain    Current Outpatient Prescriptions on File Prior to Visit  Medication Sig Dispense Refill  . acetaminophen (TYLENOL) 500 MG tablet Take 500 mg by mouth every 6 (six) hours as needed.    . Marland Kitchenctreotide (SANDOSTATIN LAR) 20 MG injection Inject 20 mg into the muscle every 28 (twenty-eight) days. Around end of month-25th or 26th    . predniSONE (DELTASONE) 10 MG tablet Take 1 tablet (10 mg total) by mouth 2 (two) times daily with a meal. (Patient not taking: Reported on 11/22/2015) 60 tablet 0   No current facility-administered medications on file prior to visit.        Objective:   Physical Exam Blood pressure 132/84, pulse 60, temperature 98.5 F (36.9 C), height 5' 6"  (1.676 m), weight 261 lb (118.389 kg), last menstrual period 04/02/1999.  Alert and oriented. Skin warm and dry.  Oral mucosa is moist.   . Sclera anicteric, conjunctivae is pink. Thyroid not enlarged. No cervical lymphadenopathy. Lungs clear. Heart regular rate and rhythm.  Abdomen is soft. Bowel sounds are positive. No hepatomegaly. No abdominal masses felt. No tenderness.  No edema to lower extremities.   Colostomy bag in place.       Assessment & Plan:  Crohn disease probably flare. She has intermittent abdominal pain. She has been intolerant of Crohn's medications in the past. Will give her Rx for Prednisone. She will start and 4m and taper by 5 each week.

## 2015-11-29 ENCOUNTER — Other Ambulatory Visit (INDEPENDENT_AMBULATORY_CARE_PROVIDER_SITE_OTHER): Payer: Self-pay | Admitting: Internal Medicine

## 2015-11-29 DIAGNOSIS — K50019 Crohn's disease of small intestine with unspecified complications: Secondary | ICD-10-CM

## 2015-11-29 MED ORDER — PREDNISONE 10 MG PO TABS: 10.0000 mg | ORAL_TABLET | Freq: Every day | ORAL | Status: DC

## 2015-11-30 DIAGNOSIS — M5412 Radiculopathy, cervical region: Secondary | ICD-10-CM | POA: Diagnosis not present

## 2015-11-30 DIAGNOSIS — M9901 Segmental and somatic dysfunction of cervical region: Secondary | ICD-10-CM | POA: Diagnosis not present

## 2015-11-30 DIAGNOSIS — M9902 Segmental and somatic dysfunction of thoracic region: Secondary | ICD-10-CM | POA: Diagnosis not present

## 2015-11-30 DIAGNOSIS — M5032 Other cervical disc degeneration, mid-cervical region, unspecified level: Secondary | ICD-10-CM | POA: Diagnosis not present

## 2016-01-08 DIAGNOSIS — M9901 Segmental and somatic dysfunction of cervical region: Secondary | ICD-10-CM | POA: Diagnosis not present

## 2016-01-08 DIAGNOSIS — M9902 Segmental and somatic dysfunction of thoracic region: Secondary | ICD-10-CM | POA: Diagnosis not present

## 2016-01-08 DIAGNOSIS — M5032 Other cervical disc degeneration, mid-cervical region, unspecified level: Secondary | ICD-10-CM | POA: Diagnosis not present

## 2016-01-08 DIAGNOSIS — M5412 Radiculopathy, cervical region: Secondary | ICD-10-CM | POA: Diagnosis not present

## 2016-01-18 DIAGNOSIS — M9902 Segmental and somatic dysfunction of thoracic region: Secondary | ICD-10-CM | POA: Diagnosis not present

## 2016-01-18 DIAGNOSIS — M9901 Segmental and somatic dysfunction of cervical region: Secondary | ICD-10-CM | POA: Diagnosis not present

## 2016-01-18 DIAGNOSIS — M5032 Other cervical disc degeneration, mid-cervical region, unspecified level: Secondary | ICD-10-CM | POA: Diagnosis not present

## 2016-01-18 DIAGNOSIS — M5412 Radiculopathy, cervical region: Secondary | ICD-10-CM | POA: Diagnosis not present

## 2016-03-02 ENCOUNTER — Other Ambulatory Visit (INDEPENDENT_AMBULATORY_CARE_PROVIDER_SITE_OTHER): Payer: Self-pay | Admitting: Internal Medicine

## 2016-03-05 ENCOUNTER — Telehealth (INDEPENDENT_AMBULATORY_CARE_PROVIDER_SITE_OTHER): Payer: Self-pay | Admitting: Internal Medicine

## 2016-03-05 DIAGNOSIS — K50019 Crohn's disease of small intestine with unspecified complications: Secondary | ICD-10-CM

## 2016-03-05 NOTE — Telephone Encounter (Signed)
error 

## 2016-03-05 NOTE — Telephone Encounter (Signed)
Please put pharmacy in computer where she wants this medicine sent to

## 2016-03-05 NOTE — Telephone Encounter (Signed)
Kiara Welch called saying she's staying with her daughter in [place] and needs Terri to send a Rx of Prednisone to the [facility] there. The phone number for the pharmacy per the patient is: [phone]. She said she had some pills but lost them while moving and needs a new Rx. Please call the pt if needed.  Pt's ph# [phone] Thank you.

## 2016-03-05 NOTE — Telephone Encounter (Signed)
Please put pharmacy in the computer

## 2016-03-06 ENCOUNTER — Telehealth (INDEPENDENT_AMBULATORY_CARE_PROVIDER_SITE_OTHER): Payer: Self-pay | Admitting: Internal Medicine

## 2016-03-06 DIAGNOSIS — K50019 Crohn's disease of small intestine with unspecified complications: Secondary | ICD-10-CM

## 2016-03-06 MED ORDER — PREDNISONE 10 MG PO TABS: 10.0000 mg | ORAL_TABLET | Freq: Every day | ORAL | Status: DC

## 2016-03-06 NOTE — Telephone Encounter (Signed)
The correct pharmacy has been placed in the pt's chart. [facility] in [place] on [address].  Thank you.

## 2016-03-06 NOTE — Telephone Encounter (Signed)
Rx sent to pharmacy listed

## 2016-03-06 NOTE — Telephone Encounter (Signed)
Rx sent to her pharmacy listed

## 2016-03-11 NOTE — Telephone Encounter (Signed)
This has been addressed.

## 2016-03-19 DIAGNOSIS — Z0289 Encounter for other administrative examinations: Secondary | ICD-10-CM

## 2016-04-19 ENCOUNTER — Encounter (INDEPENDENT_AMBULATORY_CARE_PROVIDER_SITE_OTHER): Payer: Self-pay | Admitting: *Deleted

## 2016-05-09 ENCOUNTER — Ambulatory Visit (INDEPENDENT_AMBULATORY_CARE_PROVIDER_SITE_OTHER): Payer: Medicare Other | Admitting: Internal Medicine

## 2016-05-09 ENCOUNTER — Encounter (INDEPENDENT_AMBULATORY_CARE_PROVIDER_SITE_OTHER): Payer: Self-pay | Admitting: *Deleted

## 2016-05-31 ENCOUNTER — Encounter (INDEPENDENT_AMBULATORY_CARE_PROVIDER_SITE_OTHER): Payer: Self-pay | Admitting: Internal Medicine

## 2016-06-06 ENCOUNTER — Encounter (INDEPENDENT_AMBULATORY_CARE_PROVIDER_SITE_OTHER): Payer: Self-pay | Admitting: Internal Medicine

## 2016-06-06 ENCOUNTER — Ambulatory Visit (INDEPENDENT_AMBULATORY_CARE_PROVIDER_SITE_OTHER): Payer: Medicare Other | Admitting: Internal Medicine

## 2016-06-06 VITALS — BP 142/80 | HR 72 | Temp 98.7°F | Ht 66.0 in | Wt 252.5 lb

## 2016-06-06 DIAGNOSIS — K5000 Crohn's disease of small intestine without complications: Secondary | ICD-10-CM | POA: Diagnosis not present

## 2016-06-06 NOTE — Patient Instructions (Signed)
Labs today. OV in 6 months.

## 2016-06-06 NOTE — Progress Notes (Signed)
   Subjective:    Patient ID: Kiara Welch, female    DOB: 10/13/1956, 60 y.o.   MRN: [id number]  HPI here today for f/u of her Crohn's disease. She was last seen in November.  Her appetite is good. She has lost 8 pounds since her last visit. She has cut back on drinks. She cannot exercise due to her SVC. She is emptying her colostomy bag 8 times a day.  Presently on 69m of Prednisone.  She has pain lower abdomen and rectal pain which is intermitted.  She was diagnsed at age 60 Hx of Superior vena cava syndrome.  SVC venoplasty in May of last year. Followed by Dr. GCheryll Cockaynefor ulcer around her stoma.         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 Sees Dr. WMorton Stall She is not on any medication for her Crohn's.  Colonoscopy 05/27/2011 Dr. 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 Narrowing involving distal descending colon just inside the colostomy with active disease in the form of friable mucosa with erosions . Active disease involving ileum, ileal mucosa proximal to ileocolonic anastomosis only 15cm were examined. Biopsy: Patching chronic active ileitis (small intestine)     Review of Systems Past Medical History  Diagnosis Date  . Shortness of breath   . Crohn disease ([place]   . SVC syndrome     Past Surgical History  Procedure Laterality Date  . Colon surgery      crohn disease  . Colon surgery      ostomy revision. Fistula repaired  . Angioplasty      Superior vena cava (balloon) 07/06/2014    Allergies  Allergen Reactions  . Remicade [Infliximab] Nausea And Vomiting    REACTION: Back pain    Current Outpatient Prescriptions on File Prior to Visit  Medication Sig Dispense Refill  . acetaminophen (TYLENOL) 500 MG tablet Take 500 mg by mouth every 6 (six) hours as needed.    .Marland Kitchenoctreotide (SANDOSTATIN LAR) 20 MG injection Inject 20 mg into the muscle every 28 (twenty-eight) days. Around end of month-25th or 26th    .  predniSONE (DELTASONE) 10 MG tablet Take 1 tablet (10 mg total) by mouth daily. 120 tablet 0   No current facility-administered medications on file prior to visit.        Objective:   Physical Exam Blood pressure 142/80, pulse 72, temperature 98.7 F (37.1 C), height 5' 6"  (1.676 m), weight 252 lb 8 oz (114.533 kg), last menstrual period 04/02/1999.  Alert and oriented. Skin warm and dry. Oral mucosa is moist.   . Sclera anicteric, conjunctivae is pink. Thyroid not enlarged. No cervical lymphadenopathy. Lungs clear. Heart regular rate and rhythm.  Abdomen is soft. Bowel sounds are positive. No hepatomegaly. No abdominal masses felt. No tenderness.  No edema to lower extremities.   No rashes noted to lower extremities or back.       Assessment & Plan:  Crohn's disease. She is maintained on Prednisone 144mdaily. Will get a CBC and crp today. OV in 6 months.

## 2016-06-07 LAB — CBC WITH DIFFERENTIAL/PLATELET
BASOS ABS: 0 {cells}/uL (ref 0–200)
Basophils Relative: 0 %
Eosinophils Absolute: 0 cells/uL — ABNORMAL LOW (ref 15–500)
Eosinophils Relative: 0 %
HCT: 43.3 % (ref 35.0–45.0)
Hemoglobin: 14 g/dL (ref 11.7–15.5)
LYMPHS PCT: 16 %
Lymphs Abs: 2224 cells/uL (ref 850–3900)
MCH: 28.7 pg (ref 27.0–33.0)
MCHC: 32.3 g/dL (ref 32.0–36.0)
MCV: 88.9 fL (ref 80.0–100.0)
MONOS PCT: 2 %
MPV: 11.5 fL (ref 7.5–12.5)
Monocytes Absolute: 278 cells/uL (ref 200–950)
NEUTROS ABS: 11398 {cells}/uL — AB (ref 1500–7800)
NEUTROS PCT: 82 %
PLATELETS: 329 10*3/uL (ref 140–400)
RBC: 4.87 MIL/uL (ref 3.80–5.10)
RDW: 14.1 % (ref 11.0–15.0)
WBC: 13.9 10*3/uL — ABNORMAL HIGH (ref 3.8–10.8)

## 2016-06-07 LAB — C-REACTIVE PROTEIN: CRP: 2.1 mg/dL — ABNORMAL HIGH (ref <0.60)

## 2016-06-13 ENCOUNTER — Encounter (INDEPENDENT_AMBULATORY_CARE_PROVIDER_SITE_OTHER): Payer: Self-pay | Admitting: Internal Medicine

## 2016-07-23 ENCOUNTER — Encounter (INDEPENDENT_AMBULATORY_CARE_PROVIDER_SITE_OTHER): Payer: Self-pay | Admitting: *Deleted

## 2016-07-23 NOTE — Patient Instructions (Signed)
Patient has been called and paperwork has been copied and left at front desk for patient to pick up.  Message left on cell#.

## 2016-07-29 ENCOUNTER — Other Ambulatory Visit (INDEPENDENT_AMBULATORY_CARE_PROVIDER_SITE_OTHER): Payer: Self-pay | Admitting: Internal Medicine

## 2016-07-29 DIAGNOSIS — K50019 Crohn's disease of small intestine with unspecified complications: Secondary | ICD-10-CM

## 2016-07-31 ENCOUNTER — Other Ambulatory Visit (INDEPENDENT_AMBULATORY_CARE_PROVIDER_SITE_OTHER): Payer: Self-pay | Admitting: Internal Medicine

## 2016-07-31 DIAGNOSIS — K50019 Crohn's disease of small intestine with unspecified complications: Secondary | ICD-10-CM

## 2016-09-05 ENCOUNTER — Other Ambulatory Visit (HOSPITAL_COMMUNITY): Payer: Self-pay | Admitting: Interventional Radiology

## 2016-09-06 ENCOUNTER — Other Ambulatory Visit: Payer: Self-pay | Admitting: Radiology

## 2016-09-06 ENCOUNTER — Other Ambulatory Visit (HOSPITAL_COMMUNITY): Payer: Self-pay | Admitting: Interventional Radiology

## 2016-09-06 DIAGNOSIS — I871 Compression of vein: Secondary | ICD-10-CM

## 2016-09-09 ENCOUNTER — Encounter (HOSPITAL_COMMUNITY): Payer: Self-pay

## 2016-09-09 ENCOUNTER — Ambulatory Visit (HOSPITAL_COMMUNITY)
Admission: RE | Admit: 2016-09-09 | Discharge: 2016-09-09 | Disposition: A | Discharge status: Home / Self Care | Payer: Medicare Other | Source: Ambulatory Visit | Attending: Interventional Radiology | Admitting: Interventional Radiology

## 2016-09-09 ENCOUNTER — Other Ambulatory Visit (HOSPITAL_COMMUNITY): Payer: Self-pay | Admitting: Interventional Radiology

## 2016-09-09 DIAGNOSIS — I871 Compression of vein: Secondary | ICD-10-CM | POA: Diagnosis not present

## 2016-09-09 DIAGNOSIS — K5090 Crohn's disease, unspecified, without complications: Secondary | ICD-10-CM | POA: Diagnosis not present

## 2016-09-09 DIAGNOSIS — Z87891 Personal history of nicotine dependence: Secondary | ICD-10-CM | POA: Insufficient documentation

## 2016-09-09 DIAGNOSIS — Z7952 Long term (current) use of systemic steroids: Secondary | ICD-10-CM | POA: Insufficient documentation

## 2016-09-09 HISTORY — PX: IR GENERIC HISTORICAL: IMG1180011

## 2016-09-09 LAB — BASIC METABOLIC PANEL
ANION GAP: 10 (ref 5–15)
BUN: 38 mg/dL — ABNORMAL HIGH (ref 6–20)
CALCIUM: 9.2 mg/dL (ref 8.9–10.3)
CO2: 23 mmol/L (ref 22–32)
CREATININE: 1.14 mg/dL — AB (ref 0.44–1.00)
Chloride: 108 mmol/L (ref 101–111)
GFR, EST AFRICAN AMERICAN: 59 mL/min — AB (ref >60)
GFR, EST NON AFRICAN AMERICAN: 51 mL/min — AB (ref >60)
Glucose, Bld: 94 mg/dL (ref 65–99)
Potassium: 4.3 mmol/L (ref 3.5–5.1)
SODIUM: 141 mmol/L (ref 135–145)

## 2016-09-09 LAB — CBC
HCT: 44.6 % (ref 36.0–46.0)
HEMOGLOBIN: 14 g/dL (ref 12.0–15.0)
MCH: 30.3 pg (ref 26.0–34.0)
MCHC: 31.4 g/dL (ref 30.0–36.0)
MCV: 96.5 fL (ref 78.0–100.0)
PLATELETS: 279 10*3/uL (ref 150–400)
RBC: 4.62 MIL/uL (ref 3.87–5.11)
RDW: 14.8 % (ref 11.5–15.5)
WBC: 13.6 10*3/uL — ABNORMAL HIGH (ref 4.0–10.5)

## 2016-09-09 LAB — PROTIME-INR
INR: 0.95
PROTHROMBIN TIME: 12.7 s (ref 11.4–15.2)

## 2016-09-09 LAB — APTT: aPTT: 22 seconds — ABNORMAL LOW (ref 24–36)

## 2016-09-09 MED ORDER — MIDAZOLAM HCL 2 MG/2ML IJ SOLN
INTRAMUSCULAR | Status: AC | PRN
  Administered 2016-09-09 (×3): 1 mg via INTRAVENOUS

## 2016-09-09 MED ORDER — LIDOCAINE HCL 1 % IJ SOLN
INTRAMUSCULAR | Status: AC | PRN
  Administered 2016-09-09: 20 mL

## 2016-09-09 MED ORDER — SODIUM CHLORIDE 0.9 % IV SOLN: INTRAVENOUS | Status: DC

## 2016-09-09 MED ORDER — FENTANYL CITRATE (PF) 100 MCG/2ML IJ SOLN
INTRAMUSCULAR | Status: AC
  Filled 2016-09-09: qty 6

## 2016-09-09 MED ORDER — LIDOCAINE HCL 1 % IJ SOLN
INTRAMUSCULAR | Status: AC
  Filled 2016-09-09: qty 20

## 2016-09-09 MED ORDER — MIDAZOLAM HCL 2 MG/2ML IJ SOLN
INTRAMUSCULAR | Status: AC
  Filled 2016-09-09: qty 6

## 2016-09-09 MED ORDER — IOPAMIDOL (ISOVUE-300) INJECTION 61%
INTRAVENOUS | Status: AC
  Administered 2016-09-09: 30 mL
  Filled 2016-09-09: qty 100

## 2016-09-09 MED ORDER — FENTANYL CITRATE (PF) 100 MCG/2ML IJ SOLN
INTRAMUSCULAR | Status: AC | PRN
  Administered 2016-09-09 (×3): 50 ug via INTRAVENOUS

## 2016-09-09 NOTE — Sedation Documentation (Signed)
Patient is resting comfortably. 

## 2016-09-09 NOTE — Procedures (Signed)
Interventional Radiology Procedure Note  Procedure:  SVC venography; SVC angioplasty  Complications: None  Estimated Blood Loss: < 10 mL  Recurrent SVC stenosis treated with 14 mm balloon angioplasty.  Good angiographic result.  Kiara Welch. Kathlene Cote, M.D Pager:  [phone]

## 2016-09-09 NOTE — Discharge Instructions (Signed)
venogram, Care After Refer to this sheet in the next few weeks. These instructions provide you with information about caring for yourself after your procedure. Your health care provider may also give you more specific instructions. Your treatment has been planned according to current medical practices, but problems sometimes occur. Call your health care provider if you have any problems or questions after your procedure. WHAT TO EXPECT AFTER THE PROCEDURE After your procedure, it is typical to have the following:  Bruising at the catheter insertion site that usually fades within 1-2 weeks.  Blood collecting in the tissue (hematoma) that may be painful to the touch. It should usually decrease in size and tenderness within 1-2 weeks. HOME CARE INSTRUCTIONS  Take medicines only as directed by your health care provider.  You may shower 24-48 hours after the procedure or as directed by your health care provider. Remove the bandage (dressing) and gently wash the site with plain soap and water. Pat the area dry with a clean towel. Do not rub the site, because this may cause bleeding.  Do not take baths, swim, or use a hot tub until your health care provider approves.  Check your insertion site every day for redness, swelling, or drainage.  Do not apply powder or lotion to the site.  Do not lift over 10 lb (4.5 kg) for 5 days after your procedure or as directed by your health care provider.  Ask your health care provider when it is okay to:  Return to work or school.  Resume usual physical activities or sports.  Resume sexual activity.  Do not drive home if you are discharged the same day as the procedure. Have someone else drive you.  You may drive 24 hours after the procedure unless otherwise instructed by your health care provider.  Do not operate machinery or power tools for 24 hours after the procedure or as directed by your health care provider.  If your procedure was done as an  outpatient procedure, which means that you went home the same day as your procedure, a responsible adult should be with you for the first 24 hours after you arrive home.  Keep all follow-up visits as directed by your health care provider. This is important. SEEK MEDICAL CARE IF:  You have a fever.  You have chills.  You have increased bleeding from the catheter insertion site. Hold pressure on the site. SEEK IMMEDIATE MEDICAL CARE IF:  You have unusual pain at the catheter insertion site.  You have redness, warmth, or swelling at the catheter insertion site.  You have drainage (other than a small amount of blood on the dressing) from the catheter insertion site.  The catheter insertion site is bleeding, and the bleeding does not stop after 30 minutes of holding steady pressure on the site.  The area near or just beyond the catheter insertion site becomes pale, cool, tingly, or numb.   This information is not intended to replace advice given to you by your health care provider. Make sure you discuss any questions you have with your health care provider.   Document Released: 06/27/2005 Document Revised: 12/30/2014 Document Reviewed: 05/12/2013 Elsevier Interactive Patient Education [employer].

## 2016-09-09 NOTE — Sedation Documentation (Signed)
Patient is resting comfortably. No complaints at this time. Vitals stable.

## 2016-09-09 NOTE — Sedation Documentation (Signed)
Vital signs stable. 

## 2016-09-09 NOTE — H&P (Signed)
Chief Complaint: Patient was seen in consultation today for superior vena cava syndrome at the request of Yamagata,Glenn  Referring Physician(s): Deberah Castle NP  Supervising Physician: Aletta Edouard  Patient Status: Outpatient  History of Present Illness: Kiara Welch is a 60 y.o. female   Known to IR service Follow with Dr Sharlene Dory Vena Cava Syndrome With recurrent symptoms Stent placed 2012 Has had 2-3 months of shortness of breath; facial swelling; neck swelling Now scheduled for SVCgram with possible angioplasty/stent placement Last angioplasty 05/17/2015  Uses Prednisone daily for Chron's disease (elevated wbc)   Past Medical History:  Diagnosis Date  . Crohn disease ([place])   . Shortness of breath   . SVC syndrome     Past Surgical History:  Procedure Laterality Date  . ANGIOPLASTY     Superior vena cava (balloon) 07/06/2014  . COLON SURGERY     crohn disease  . COLON SURGERY     ostomy revision. Fistula repaired    Allergies: Remicade [infliximab]  Medications: Prior to Admission medications   Medication Sig Start Date End Date Taking? Authorizing Provider  acetaminophen (TYLENOL) 500 MG tablet Take 500 mg by mouth every 6 (six) hours as needed.   Yes Historical Provider, MD  diphenhydrAMINE (BENADRYL) 25 MG tablet Take 25 mg by mouth every 6 (six) hours as needed.   Yes Historical Provider, MD  octreotide (SANDOSTATIN LAR) 20 MG injection Inject 20 mg into the muscle every 28 (twenty-eight) days. Around end of month-25th or 26th   Yes Historical Provider, MD  predniSONE (DELTASONE) 10 MG tablet Take 20 mg by mouth daily.   Yes Historical Provider, MD     History reviewed. No pertinent family history.  Social History   Social History  . Marital status: Divorced    Spouse name: N/A  . Number of children: N/A  . Years of education: N/A   Social History Main Topics  . Smoking status: Former Smoker    Packs/day: 0.50    Years:  40.00    Types: Cigarettes    Quit date: 04/02/2007  . Smokeless tobacco: Never Used  . Alcohol use No     Comment: stopped 1997  . Drug use: No  . Sexual activity: Not Asked   Other Topics Concern  . None   Social History Narrative  . None    Review of Systems: A 12 point ROS discussed and pertinent positives are indicated in the HPI above.  All other systems are negative.  Review of Systems  Constitutional: Positive for activity change, appetite change and fatigue. Negative for fever.  Respiratory: Positive for shortness of breath. Negative for wheezing.   Cardiovascular: Negative for chest pain.  Neurological: Positive for weakness.  Psychiatric/Behavioral: Negative for behavioral problems and confusion.    Vital Signs: BP (!) 150/96   Pulse 71   Temp 97.6 F (36.4 C)   Resp 16   Ht 5' 6"  (1.676 m)   Wt 252 lb (114.3 kg)   LMP 04/02/1999   SpO2 100%   BMI 40.67 kg/m   Physical Exam  Constitutional: She appears well-nourished.  Eyes:  Facial puffiness  Cardiovascular: Normal rate, regular rhythm and normal heart sounds.   Pulmonary/Chest: Effort normal.  Abdominal: Soft. Bowel sounds are normal.  Musculoskeletal: Normal range of motion. She exhibits edema.  Minimal swelling upper extremities  Nursing note and vitals reviewed.   Mallampati Score:  MD Evaluation Airway: WNL Heart: WNL Abdomen: WNL Chest/ Lungs: WNL ASA  Classification: 2 Mallampati/Airway Score: One  Imaging: No results found.  Labs:  CBC:  Recent Labs  06/06/16 1449 09/09/16 1104  WBC 13.9* 13.6*  HGB 14.0 14.0  HCT 43.3 44.6  PLT 329 279    COAGS: No results for input(s): INR, APTT in the last 8760 hours.  BMP: No results for input(s): NA, K, CL, CO2, GLUCOSE, BUN, CALCIUM, CREATININE, GFRNONAA, GFRAA in the last 8760 hours.  Invalid input(s): CMP  LIVER FUNCTION TESTS: No results for input(s): BILITOT, AST, ALT, ALKPHOS, PROT, ALBUMIN in the last 8760  hours.  TUMOR MARKERS: No results for input(s): AFPTM, CEA, CA199, CHROMGRNA in the last 8760 hours.  Assessment and Plan:  Superior vena cava syndrome with recurrent symptoms Scheduled now for SVCgram with possible angioplasty/stent Risks and Benefits discussed with the patient including, but not limited to bleeding, infection, vascular injury or contrast induced renal failure. All of the patient's questions were answered, patient is agreeable to proceed. Consent signed and in chart.   Thank you for this interesting consult.  I greatly enjoyed meeting Kiara Welch and look forward to participating in their care.  A copy of this report was sent to the requesting provider on this date.  Electronically Signed: Nana Hoselton A 09/09/2016, 11:41 AM   I spent a total of  30 Minutes   in face to face in clinical consultation, greater than 50% of which was counseling/coordinating care for SVC gram with poss pta/stent

## 2016-11-04 ENCOUNTER — Encounter (INDEPENDENT_AMBULATORY_CARE_PROVIDER_SITE_OTHER): Payer: Self-pay | Admitting: Internal Medicine

## 2016-12-09 ENCOUNTER — Ambulatory Visit (INDEPENDENT_AMBULATORY_CARE_PROVIDER_SITE_OTHER): Payer: Medicare Other | Admitting: Internal Medicine

## 2016-12-09 ENCOUNTER — Telehealth (INDEPENDENT_AMBULATORY_CARE_PROVIDER_SITE_OTHER): Payer: Self-pay | Admitting: Internal Medicine

## 2016-12-09 DIAGNOSIS — K5000 Crohn's disease of small intestine without complications: Secondary | ICD-10-CM

## 2016-12-09 MED ORDER — PREDNISONE 10 MG PO TABS: 10.0000 mg | ORAL_TABLET | ORAL | 3 refills | Status: DC | PRN

## 2016-12-09 NOTE — Telephone Encounter (Signed)
Patient called, she cancelled her appointment for today due to car problems.  She would like a refill on Prednisone.  [phone]

## 2016-12-09 NOTE — Telephone Encounter (Signed)
Rx sent to her pharmacy 

## 2016-12-09 NOTE — Telephone Encounter (Signed)
Please let patient know I sent an Rx to her pharmacy

## 2016-12-10 NOTE — Telephone Encounter (Signed)
I called the patient and advised her.

## 2017-01-17 ENCOUNTER — Encounter (INDEPENDENT_AMBULATORY_CARE_PROVIDER_SITE_OTHER): Payer: Self-pay | Admitting: Internal Medicine

## 2017-01-24 ENCOUNTER — Encounter: Payer: Self-pay | Admitting: Internal Medicine

## 2017-02-11 ENCOUNTER — Encounter (INDEPENDENT_AMBULATORY_CARE_PROVIDER_SITE_OTHER): Payer: Self-pay | Admitting: *Deleted

## 2017-02-13 ENCOUNTER — Telehealth (INDEPENDENT_AMBULATORY_CARE_PROVIDER_SITE_OTHER): Payer: Self-pay | Admitting: Internal Medicine

## 2017-02-13 NOTE — Telephone Encounter (Signed)
Patient called to make an appointment and also wanted to know if Tammy was ever able to call to have her medication delivered to her home instead of the office.  She stated she knows they can, but it has to be approved by our office first.  [phone]

## 2017-02-17 NOTE — Telephone Encounter (Signed)
I have called [employer] and spoke with a representative. She spoke with the pharmacist who changed the shipping address to  [place]  [place] [address] [phone] [facility] patient and left a message with the above information.

## 2017-02-26 ENCOUNTER — Ambulatory Visit (INDEPENDENT_AMBULATORY_CARE_PROVIDER_SITE_OTHER): Payer: Medicare Other | Admitting: Internal Medicine

## 2017-03-05 ENCOUNTER — Encounter (INDEPENDENT_AMBULATORY_CARE_PROVIDER_SITE_OTHER): Payer: Self-pay | Admitting: Internal Medicine

## 2017-03-05 ENCOUNTER — Ambulatory Visit (INDEPENDENT_AMBULATORY_CARE_PROVIDER_SITE_OTHER): Payer: Medicare Other | Admitting: Internal Medicine

## 2017-03-05 VITALS — BP 122/86 | HR 80 | Temp 98.1°F | Ht 66.0 in | Wt 258.0 lb

## 2017-03-05 DIAGNOSIS — K5000 Crohn's disease of small intestine without complications: Secondary | ICD-10-CM | POA: Diagnosis not present

## 2017-03-05 LAB — CBC WITH DIFFERENTIAL/PLATELET
BASOS ABS: 0 {cells}/uL (ref 0–200)
Basophils Relative: 0 %
EOS PCT: 4 %
Eosinophils Absolute: 372 cells/uL (ref 15–500)
HCT: 40.6 % (ref 35.0–45.0)
HEMOGLOBIN: 13.1 g/dL (ref 11.7–15.5)
LYMPHS PCT: 27 %
Lymphs Abs: 2511 cells/uL (ref 850–3900)
MCH: 29.2 pg (ref 27.0–33.0)
MCHC: 32.3 g/dL (ref 32.0–36.0)
MCV: 90.4 fL (ref 80.0–100.0)
MONO ABS: 651 {cells}/uL (ref 200–950)
MPV: 11 fL (ref 7.5–12.5)
Monocytes Relative: 7 %
NEUTROS PCT: 62 %
Neutro Abs: 5766 cells/uL (ref 1500–7800)
PLATELETS: 305 10*3/uL (ref 140–400)
RBC: 4.49 MIL/uL (ref 3.80–5.10)
RDW: 14.1 % (ref 11.0–15.0)
WBC: 9.3 10*3/uL (ref 3.8–10.8)

## 2017-03-05 MED ORDER — PREDNISONE 10 MG PO TABS: 10.0000 mg | ORAL_TABLET | ORAL | 3 refills | Status: DC | PRN

## 2017-03-05 NOTE — Patient Instructions (Addendum)
Labs today. OV in 6 months

## 2017-03-05 NOTE — Progress Notes (Signed)
   Subjective:    Patient ID: Kiara Welch, female    DOB: 1956/09/04, 61 y.o.   MRN: [id number] HPI Here today for f/u. She was last seen in June of 2017. Hx of Crohn's. She was diagnosed at about age 19. She tells me she is doing about the same. Her appetite is good. She has gained from 252 to 258. She is emptying her colostomy bag 10 times a day.  Presently taking 66m of Prednisone. No rash at present.  Hx of colostomy.   CBC    Component Value Date/Time   WBC 13.6 (H) 09/09/2016 1104   RBC 4.62 09/09/2016 1104   HGB 14.0 09/09/2016 1104   HCT 44.6 09/09/2016 1104   PLT 279 09/09/2016 1104   MCV 96.5 09/09/2016 1104   MCH 30.3 09/09/2016 1104   MCHC 31.4 09/09/2016 1104   RDW 14.8 09/09/2016 1104   LYMPHSABS 2,224 06/06/2016 1449   MONOABS 278 06/06/2016 1449   EOSABS 0 (L) 06/06/2016 1449   BASOSABS 0 06/06/2016 1449   06/06/2016 CRP 2.1   Review of Systems Past Medical History:  Diagnosis Date  . Crohn disease ([place]   . Shortness of breath   . SVC syndrome     Past Surgical History:  Procedure Laterality Date  . ANGIOPLASTY     Superior vena cava (balloon) 07/06/2014  . COLON SURGERY     crohn disease  . COLON SURGERY     ostomy revision. Fistula repaired  . IR GENERIC HISTORICAL  09/09/2016   IR VENOCAVAGRAM SVC 09/09/2016 GAletta Edouard MD [facility]  . IR GENERIC HISTORICAL  09/09/2016   IR PTA VENOUS EXCEPT DIALYSIS CIRCUIT 09/09/2016 GAletta Edouard MD [facility]  . IR GENERIC HISTORICAL  09/09/2016   IR [place] VASC ACCESS RIGHT 09/09/2016 GAletta Edouard MD [facility]    Allergies  Allergen Reactions  . Remicade [Infliximab] Nausea And Vomiting    REACTION: Back pain    Current Outpatient Prescriptions on File Prior to Visit  Medication Sig Dispense Refill  . acetaminophen (TYLENOL) 500 MG tablet Take 500 mg by mouth every 6 (six) hours as needed.    . diphenhydrAMINE (BENADRYL) 25 MG tablet Take 25 mg by mouth every 6 (six) hours  as needed.    .Marland Kitchenoctreotide (SANDOSTATIN LAR) 20 MG injection Inject 20 mg into the muscle every 28 (twenty-eight) days. Around end of month-25th or 26th    . predniSONE (DELTASONE) 10 MG tablet Take 1 tablet (10 mg total) by mouth as needed. (Patient taking differently: Take 20 mg by mouth as needed. ) 120 tablet 3   No current facility-administered medications on file prior to visit.        Objective:   Physical Exam Blood pressure 122/86, pulse 80, temperature 98.1 F (36.7 C), height 5' 6"  (1.676 m), weight 258 lb (117 kg), last menstrual period 04/02/1999. Alert and oriented. Skin warm and dry. Oral mucosa is moist.   . Sclera anicteric, conjunctivae is pink. Thyroid not enlarged. No cervical lymphadenopathy. Lungs clear. Heart regular rate and rhythm.  Abdomen is soft. Bowel sounds are positive. No hepatomegaly. No abdominal masses felt. No tenderness.  No edema to lower extremities. Colostomy bag in place.         Assessment & Plan:  Crohn's. She seems to be doing good. Am going to get a CBC and CRP today. OV in 6 months.

## 2017-03-06 LAB — C-REACTIVE PROTEIN: CRP: 54.6 mg/L — AB (ref <8.0)

## 2017-04-16 DIAGNOSIS — N611 Abscess of the breast and nipple: Secondary | ICD-10-CM | POA: Diagnosis not present

## 2017-04-16 DIAGNOSIS — G894 Chronic pain syndrome: Secondary | ICD-10-CM | POA: Diagnosis not present

## 2017-04-16 DIAGNOSIS — K5090 Crohn's disease, unspecified, without complications: Secondary | ICD-10-CM | POA: Diagnosis not present

## 2017-06-10 DIAGNOSIS — K5010 Crohn's disease of large intestine without complications: Secondary | ICD-10-CM | POA: Diagnosis not present

## 2017-06-10 DIAGNOSIS — M130 Polyarthritis, unspecified: Secondary | ICD-10-CM | POA: Diagnosis not present

## 2017-06-10 DIAGNOSIS — S2520XD Unspecified injury of superior vena cava, subsequent encounter: Secondary | ICD-10-CM | POA: Diagnosis not present

## 2017-06-10 DIAGNOSIS — M5412 Radiculopathy, cervical region: Secondary | ICD-10-CM | POA: Diagnosis not present

## 2017-06-10 DIAGNOSIS — Z79899 Other long term (current) drug therapy: Secondary | ICD-10-CM | POA: Diagnosis not present

## 2017-06-18 ENCOUNTER — Encounter (INDEPENDENT_AMBULATORY_CARE_PROVIDER_SITE_OTHER): Payer: Self-pay | Admitting: Internal Medicine

## 2017-08-05 DIAGNOSIS — M130 Polyarthritis, unspecified: Secondary | ICD-10-CM | POA: Diagnosis not present

## 2017-08-05 DIAGNOSIS — K5010 Crohn's disease of large intestine without complications: Secondary | ICD-10-CM | POA: Diagnosis not present

## 2017-08-05 DIAGNOSIS — M5412 Radiculopathy, cervical region: Secondary | ICD-10-CM | POA: Diagnosis not present

## 2017-08-05 DIAGNOSIS — S2520XD Unspecified injury of superior vena cava, subsequent encounter: Secondary | ICD-10-CM | POA: Diagnosis not present

## 2017-09-08 ENCOUNTER — Ambulatory Visit (INDEPENDENT_AMBULATORY_CARE_PROVIDER_SITE_OTHER): Payer: Medicare Other | Admitting: Internal Medicine

## 2017-09-10 ENCOUNTER — Ambulatory Visit (INDEPENDENT_AMBULATORY_CARE_PROVIDER_SITE_OTHER): Payer: Medicare Other | Admitting: Internal Medicine

## 2017-10-06 DIAGNOSIS — K5010 Crohn's disease of large intestine without complications: Secondary | ICD-10-CM | POA: Diagnosis not present

## 2017-10-06 DIAGNOSIS — M5412 Radiculopathy, cervical region: Secondary | ICD-10-CM | POA: Diagnosis not present

## 2017-10-06 DIAGNOSIS — M130 Polyarthritis, unspecified: Secondary | ICD-10-CM | POA: Diagnosis not present

## 2017-10-14 ENCOUNTER — Other Ambulatory Visit (HOSPITAL_COMMUNITY): Payer: Self-pay | Admitting: Interventional Radiology

## 2017-10-14 DIAGNOSIS — I871 Compression of vein: Secondary | ICD-10-CM

## 2017-10-21 ENCOUNTER — Other Ambulatory Visit: Payer: Self-pay | Admitting: Radiology

## 2017-10-22 ENCOUNTER — Ambulatory Visit (HOSPITAL_COMMUNITY): Admission: RE | Admit: 2017-10-22 | Payer: Medicare Other | Source: Ambulatory Visit

## 2017-11-06 ENCOUNTER — Encounter (INDEPENDENT_AMBULATORY_CARE_PROVIDER_SITE_OTHER): Payer: Self-pay | Admitting: Internal Medicine

## 2017-11-10 ENCOUNTER — Other Ambulatory Visit: Payer: Self-pay | Admitting: Radiology

## 2017-11-11 ENCOUNTER — Ambulatory Visit (HOSPITAL_COMMUNITY)
Admission: RE | Admit: 2017-11-11 | Discharge: 2017-11-11 | Disposition: A | Discharge status: Home / Self Care | Payer: Medicare Other | Source: Ambulatory Visit | Attending: Interventional Radiology | Admitting: Interventional Radiology

## 2017-11-11 ENCOUNTER — Encounter (HOSPITAL_COMMUNITY): Payer: Self-pay

## 2017-11-11 ENCOUNTER — Other Ambulatory Visit (HOSPITAL_COMMUNITY): Payer: Self-pay | Admitting: Interventional Radiology

## 2017-11-11 DIAGNOSIS — T82858A Stenosis of vascular prosthetic devices, implants and grafts, initial encounter: Secondary | ICD-10-CM | POA: Insufficient documentation

## 2017-11-11 DIAGNOSIS — Z87891 Personal history of nicotine dependence: Secondary | ICD-10-CM | POA: Insufficient documentation

## 2017-11-11 DIAGNOSIS — I871 Compression of vein: Secondary | ICD-10-CM | POA: Insufficient documentation

## 2017-11-11 DIAGNOSIS — K5090 Crohn's disease, unspecified, without complications: Secondary | ICD-10-CM | POA: Insufficient documentation

## 2017-11-11 DIAGNOSIS — Z7952 Long term (current) use of systemic steroids: Secondary | ICD-10-CM | POA: Diagnosis not present

## 2017-11-11 DIAGNOSIS — R220 Localized swelling, mass and lump, head: Secondary | ICD-10-CM | POA: Diagnosis not present

## 2017-11-11 DIAGNOSIS — Y831 Surgical operation with implant of artificial internal device as the cause of abnormal reaction of the patient, or of later complication, without mention of misadventure at the time of the procedure: Secondary | ICD-10-CM | POA: Diagnosis not present

## 2017-11-11 HISTORY — PX: IR VENOCAVAGRAM SVC: IMG679

## 2017-11-11 HISTORY — PX: IR PTA VENOUS EXCEPT DIALYSIS CIRCUIT: IMG6126

## 2017-11-11 HISTORY — PX: IR US GUIDE VASC ACCESS RIGHT: IMG2390

## 2017-11-11 LAB — BASIC METABOLIC PANEL
ANION GAP: 12 (ref 5–15)
BUN: 34 mg/dL — ABNORMAL HIGH (ref 6–20)
CALCIUM: 9.1 mg/dL (ref 8.9–10.3)
CO2: 22 mmol/L (ref 22–32)
Chloride: 109 mmol/L (ref 101–111)
Creatinine, Ser: 1.04 mg/dL — ABNORMAL HIGH (ref 0.44–1.00)
GFR calc Af Amer: >60 mL/min (ref >60)
GFR, EST NON AFRICAN AMERICAN: 56 mL/min — AB (ref >60)
Glucose, Bld: 122 mg/dL — ABNORMAL HIGH (ref 65–99)
POTASSIUM: 3.5 mmol/L (ref 3.5–5.1)
SODIUM: 143 mmol/L (ref 135–145)

## 2017-11-11 LAB — PROTIME-INR
INR: 0.94
PROTHROMBIN TIME: 12.5 s (ref 11.4–15.2)

## 2017-11-11 LAB — CBC
HCT: 43.3 % (ref 36.0–46.0)
HEMOGLOBIN: 14 g/dL (ref 12.0–15.0)
MCH: 31.3 pg (ref 26.0–34.0)
MCHC: 32.3 g/dL (ref 30.0–36.0)
MCV: 96.7 fL (ref 78.0–100.0)
Platelets: 221 10*3/uL (ref 150–400)
RBC: 4.48 MIL/uL (ref 3.87–5.11)
RDW: 13.7 % (ref 11.5–15.5)
WBC: 13.2 10*3/uL — AB (ref 4.0–10.5)

## 2017-11-11 LAB — APTT: APTT: 24 s (ref 24–36)

## 2017-11-11 MED ORDER — FENTANYL CITRATE (PF) 100 MCG/2ML IJ SOLN
INTRAMUSCULAR | Status: AC
  Filled 2017-11-11: qty 2

## 2017-11-11 MED ORDER — SODIUM CHLORIDE 0.9 % IV SOLN: INTRAVENOUS | Status: DC

## 2017-11-11 MED ORDER — MIDAZOLAM HCL 2 MG/2ML IJ SOLN
INTRAMUSCULAR | Status: AC | PRN
  Administered 2017-11-11 (×3): 1 mg via INTRAVENOUS

## 2017-11-11 MED ORDER — LIDOCAINE HCL 1 % IJ SOLN
INTRAMUSCULAR | Status: AC | PRN
  Administered 2017-11-11: 8 mL

## 2017-11-11 MED ORDER — MIDAZOLAM HCL 2 MG/2ML IJ SOLN
INTRAMUSCULAR | Status: AC
  Filled 2017-11-11: qty 2

## 2017-11-11 MED ORDER — HEPARIN SODIUM (PORCINE) 1000 UNIT/ML IJ SOLN
INTRAMUSCULAR | Status: AC
  Filled 2017-11-11: qty 1

## 2017-11-11 MED ORDER — FENTANYL CITRATE (PF) 100 MCG/2ML IJ SOLN
INTRAMUSCULAR | Status: AC | PRN
  Administered 2017-11-11 (×3): 50 ug via INTRAVENOUS

## 2017-11-11 MED ORDER — IOPAMIDOL (ISOVUE-300) INJECTION 61%
INTRAVENOUS | Status: AC
  Administered 2017-11-11: 20 mL
  Filled 2017-11-11: qty 150

## 2017-11-11 MED ORDER — LIDOCAINE HCL 1 % IJ SOLN
INTRAMUSCULAR | Status: AC
  Filled 2017-11-11: qty 20

## 2017-11-11 MED ORDER — SODIUM CHLORIDE 0.9 % IV SOLN
INTRAVENOUS | Status: AC | PRN
  Administered 2017-11-11: 10 mL/h via INTRAVENOUS

## 2017-11-11 NOTE — Sedation Documentation (Signed)
Patient is resting comfortably. 

## 2017-11-11 NOTE — Procedures (Signed)
Interventional Radiology Procedure Note  Procedure: SVC venogram and SVC angioplasty  Complications: None  Estimated Blood Loss: None  Recurrent stenosis in SVC stent treated with 14 mm angioplasty with good result.  Venetia Night. Kathlene Cote, M.D Pager:  [phone]

## 2017-11-11 NOTE — Consult Note (Signed)
Chief Complaint: Patient was seen in consultation today for Superior vena cavagram  at the request of [place]   Supervising Physician: Aletta Edouard  Patient Status: [facility] - Out-pt  History of Present Illness: Kiara Welch is a 62 y.o. female   Known recurrent superior vena cava syndrome Facial swelling and sob for months Last intervention - angioplasty; 08/2016: Recurrent moderate SVC stenosis within previously placed SVC stent. This was treated with 14 mm balloon angioplasty. There was angiographic evidence of significant improvement after balloon Dilatation.  Has been seeing Dr Kathlene Cote since 2012 for same Scheduled now for venacavagram and possible angioplasty/stent placement  Past Medical History:  Diagnosis Date  . Crohn disease ([place])   . Shortness of breath   . SVC syndrome     Past Surgical History:  Procedure Laterality Date  . ANGIOPLASTY     Superior vena cava (balloon) 07/06/2014  . COLON SURGERY     crohn disease  . COLON SURGERY     ostomy revision. Fistula repaired  . IR GENERIC HISTORICAL  09/09/2016   IR VENOCAVAGRAM SVC 09/09/2016 Aletta Edouard, MD [facility]  . IR GENERIC HISTORICAL  09/09/2016   IR PTA VENOUS EXCEPT DIALYSIS CIRCUIT 09/09/2016 Aletta Edouard, MD [facility]  . IR GENERIC HISTORICAL  09/09/2016   IR US GUIDE VASC ACCESS RIGHT 09/09/2016 Aletta Edouard, MD [facility]    Allergies: Remicade [infliximab]  Medications: Prior to Admission medications   Medication Sig Start Date End Date Taking? Authorizing Provider  acetaminophen (TYLENOL) 500 MG tablet Take 500 mg by mouth every 6 (six) hours as needed for mild pain or moderate pain.    Yes [provider]  diphenhydrAMINE (BENADRYL) 25 MG tablet Take 25 mg by mouth every 6 (six) hours as needed for allergies.    Yes [provider]  octreotide (SANDOSTATIN LAR) 20 MG injection Inject 20 mg into the muscle every 28 (twenty-eight) days. Around end of  month-25th or 26th   Yes [provider]  oxyCODONE-acetaminophen (PERCOCET) 7.5-325 MG tablet Take 1 tablet by mouth every 8 (eight) hours as needed for severe pain.   Yes [provider]  predniSONE (DELTASONE) 10 MG tablet Take 1 tablet (10 mg total) by mouth as needed. Patient taking differently: Take 30 mg by mouth daily with breakfast.  03/05/17  Yes Setzer, Rona Ravens, NP     History reviewed. No pertinent family history.  Social History   Socioeconomic History  . Marital status: Divorced    Spouse name: None  . Number of children: None  . Years of education: None  . Highest education level: None  Social Needs  . Financial resource strain: None  . Food insecurity - worry: None  . Food insecurity - inability: None  . Transportation needs - medical: None  . Transportation needs - non-medical: None  Occupational History  . None  Tobacco Use  . Smoking status: Former Smoker    Packs/day: 0.50    Years: 40.00    Pack years: 20.00    Types: Cigarettes    Last attempt to quit: 04/02/2007    Years since quitting: 10.6  . Smokeless tobacco: Never Used  Substance and Sexual Activity  . Alcohol use: No    Comment: stopped 1997  . Drug use: No  . Sexual activity: None  Other Topics Concern  . None  Social History Narrative  . None    Review of Systems: A 12 point ROS discussed and pertinent positives  are indicated in the HPI above.  All other systems are negative.  Review of Systems  Constitutional: Positive for fatigue. Negative for activity change and fever.  HENT: Positive for facial swelling.   Respiratory: Positive for shortness of breath. Negative for cough.   Cardiovascular: Negative for chest pain.  Gastrointestinal: Negative for abdominal pain.  Musculoskeletal: Negative for back pain.  Neurological: Positive for weakness.  Psychiatric/Behavioral: Negative for behavioral problems and confusion.    Vital Signs: BP (!) 154/87 (BP Location:  Right Arm)   Pulse 93   Temp 97.7 F (36.5 C) (Oral)   Ht 5' 6"  (1.676 m)   Wt 279 lb (126.6 kg)   LMP 04/02/1999   SpO2 93%   BMI 45.03 kg/m   Physical Exam  Constitutional: She is oriented to person, place, and time.  Cardiovascular: Normal rate, regular rhythm and normal heart sounds.  Pulmonary/Chest: Effort normal and breath sounds normal. She has no wheezes.  Abdominal: Soft. Bowel sounds are normal. There is no tenderness.  Musculoskeletal: Normal range of motion.  Neurological: She is alert and oriented to person, place, and time.  Skin: Skin is warm and dry.  Psychiatric: She has a normal mood and affect. Her behavior is normal. Judgment and thought content normal.  Nursing note and vitals reviewed.   Imaging: No results found.  Labs:  CBC: Recent Labs    03/05/17 1516  WBC 9.3  HGB 13.1  HCT 40.6  PLT 305    COAGS: No results for input(s): INR, APTT in the last 8760 hours.  BMP: No results for input(s): NA, K, CL, CO2, GLUCOSE, BUN, CALCIUM, CREATININE, GFRNONAA, GFRAA in the last 8760 hours.  Invalid input(s): CMP  LIVER FUNCTION TESTS: No results for input(s): BILITOT, AST, ALT, ALKPHOS, PROT, ALBUMIN in the last 8760 hours.  TUMOR MARKERS: No results for input(s): AFPTM, CEA, CA199, CHROMGRNA in the last 8760 hours.  Assessment and Plan:  Recurrent superior vena cava syndrome Last intervention 08/2016 Now scheduled for venacavagram with possible angioplasty/stent placement Pt is aware of procedure benefits and risks including  but not limited to: Infection; bleeding; vessel damage Agreeable to proceed Consent signed and in chart  Thank you for this interesting consult.  I greatly enjoyed meeting Kiara Welch and look forward to participating in their care.  A copy of this report was sent to the requesting provider on this date.  Electronically Signed: Lavonia Drafts, PA-C 11/11/2017, 9:25 AM   I spent a total of  30 Minutes   in face  to face in clinical consultation, greater than 50% of which was counseling/coordinating care for superior venacavagram

## 2017-11-11 NOTE — Discharge Instructions (Signed)
Moderate Conscious Sedation, Adult, Care After These instructions provide you with information about caring for yourself after your procedure. Your health care provider may also give you more specific instructions. Your treatment has been planned according to current medical practices, but problems sometimes occur. Call your health care provider if you have any problems or questions after your procedure. What can I expect after the procedure? After your procedure, it is common:  To feel sleepy for several hours.  To feel clumsy and have poor balance for several hours.  To have poor judgment for several hours.  To vomit if you eat too soon.  Follow these instructions at home: For at least 24 hours after the procedure:   Do not: ? Participate in activities where you could fall or become injured. ? Drive. ? Use heavy machinery. ? Drink alcohol. ? Take sleeping pills or medicines that cause drowsiness. ? Make important decisions or sign legal documents. ? Take care of children on your own.  Rest. Eating and drinking  Follow the diet recommended by your health care provider.  If you vomit: ? Drink water, juice, or soup when you can drink without vomiting. ? Make sure you have little or no nausea before eating solid foods. General instructions  Have a responsible adult stay with you until you are awake and alert.  Take over-the-counter and prescription medicines only as told by your health care provider.  If you smoke, do not smoke without supervision.  Keep all follow-up visits as told by your health care provider. This is important. Contact a health care provider if:  You keep feeling nauseous or you keep vomiting.  You feel light-headed.  You develop a rash.  You have a fever. Get help right away if:  You have trouble breathing. This information is not intended to replace advice given to you by your health care provider. Make sure you discuss any questions you  have with your health care provider. Document Released: 09/29/2013 Document Revised: 05/13/2016 Document Reviewed: 03/30/2016 Elsevier Interactive Patient Education  2018 [place].    Femoral Site Care Refer to this sheet in the next few weeks. These instructions provide you with information about caring for yourself after your procedure. Your health care provider may also give you more specific instructions. Your treatment has been planned according to current medical practices, but problems sometimes occur. Call your health care provider if you have any problems or questions after your procedure. What can I expect after the procedure? After your procedure, it is typical to have the following:  Bruising at the site that usually fades within 1-2 weeks.  Blood collecting in the tissue (hematoma) that may be painful to the touch. It should usually decrease in size and tenderness within 1-2 weeks.  Follow these instructions at home:  Take medicines only as directed by your health care provider.  You may shower 24-48 hours after the procedure or as directed by your health care provider. Remove the bandage (dressing) and gently wash the site with plain soap and water. Pat the area dry with a clean towel. Do not rub the site, because this may cause bleeding.  Do not take baths, swim, or use a hot tub until your health care provider approves.  Check your insertion site every day for redness, swelling, or drainage.  Do not apply powder or lotion to the site.  Limit use of stairs to twice a day for the first 2-3 days or as directed by your health care  provider.  Do not squat for the first 2-3 days or as directed by your health care provider.  Do not lift over 10 lb (4.5 kg) for 5 days after your procedure or as directed by your health care provider.  Ask your health care provider when it is okay to: ? Return to work or school. ? Resume usual physical activities or sports. ? Resume  sexual activity.  Do not drive home if you are discharged the same day as the procedure. Have someone else drive you.  You may drive 24 hours after the procedure unless otherwise instructed by your health care provider.  Do not operate machinery or power tools for 24 hours after the procedure or as directed by your health care provider.  If your procedure was done as an outpatient procedure, which means that you went home the same day as your procedure, a responsible adult should be with you for the first 24 hours after you arrive home.  Keep all follow-up visits as directed by your health care provider. This is important. Contact a health care provider if:  You have a fever.  You have chills.  You have increased bleeding from the site. Hold pressure on the site. Get help right away if:  You have unusual pain at the site.  You have redness, warmth, or swelling at the site.  You have drainage (other than a small amount of blood on the dressing) from the site.  The site is bleeding, and the bleeding does not stop after 30 minutes of holding steady pressure on the site.  Your leg or foot becomes pale, cool, tingly, or numb. This information is not intended to replace advice given to you by your health care provider. Make sure you discuss any questions you have with your health care provider. Document Released: 08/12/2014 Document Revised: 05/16/2016 Document Reviewed: 06/28/2014 Elsevier Interactive Patient Education  [employer].

## 2017-11-12 ENCOUNTER — Other Ambulatory Visit (INDEPENDENT_AMBULATORY_CARE_PROVIDER_SITE_OTHER): Payer: Self-pay | Admitting: Internal Medicine

## 2017-11-12 DIAGNOSIS — K5000 Crohn's disease of small intestine without complications: Secondary | ICD-10-CM

## 2017-12-05 DIAGNOSIS — K5010 Crohn's disease of large intestine without complications: Secondary | ICD-10-CM | POA: Diagnosis not present

## 2017-12-05 DIAGNOSIS — M130 Polyarthritis, unspecified: Secondary | ICD-10-CM | POA: Diagnosis not present

## 2017-12-05 DIAGNOSIS — M5412 Radiculopathy, cervical region: Secondary | ICD-10-CM | POA: Diagnosis not present

## 2017-12-09 DIAGNOSIS — M1990 Unspecified osteoarthritis, unspecified site: Secondary | ICD-10-CM | POA: Diagnosis not present

## 2017-12-09 DIAGNOSIS — K5090 Crohn's disease, unspecified, without complications: Secondary | ICD-10-CM | POA: Diagnosis not present

## 2017-12-09 DIAGNOSIS — E119 Type 2 diabetes mellitus without complications: Secondary | ICD-10-CM | POA: Diagnosis not present

## 2017-12-09 DIAGNOSIS — E872 Acidosis: Secondary | ICD-10-CM | POA: Diagnosis present

## 2017-12-09 DIAGNOSIS — R1032 Left lower quadrant pain: Secondary | ICD-10-CM | POA: Diagnosis not present

## 2017-12-09 DIAGNOSIS — N189 Chronic kidney disease, unspecified: Secondary | ICD-10-CM | POA: Diagnosis present

## 2017-12-09 DIAGNOSIS — Z933 Colostomy status: Secondary | ICD-10-CM | POA: Diagnosis not present

## 2017-12-09 DIAGNOSIS — Z87442 Personal history of urinary calculi: Secondary | ICD-10-CM | POA: Diagnosis not present

## 2017-12-09 DIAGNOSIS — N132 Hydronephrosis with renal and ureteral calculous obstruction: Secondary | ICD-10-CM | POA: Diagnosis present

## 2017-12-09 DIAGNOSIS — E860 Dehydration: Secondary | ICD-10-CM | POA: Diagnosis present

## 2017-12-09 DIAGNOSIS — Z7952 Long term (current) use of systemic steroids: Secondary | ICD-10-CM | POA: Diagnosis not present

## 2017-12-09 DIAGNOSIS — N202 Calculus of kidney with calculus of ureter: Secondary | ICD-10-CM | POA: Diagnosis not present

## 2017-12-09 DIAGNOSIS — Z79899 Other long term (current) drug therapy: Secondary | ICD-10-CM | POA: Diagnosis not present

## 2017-12-09 DIAGNOSIS — N201 Calculus of ureter: Secondary | ICD-10-CM | POA: Diagnosis not present

## 2017-12-09 DIAGNOSIS — N1330 Unspecified hydronephrosis: Secondary | ICD-10-CM | POA: Diagnosis not present

## 2017-12-09 DIAGNOSIS — D72829 Elevated white blood cell count, unspecified: Secondary | ICD-10-CM | POA: Diagnosis not present

## 2017-12-09 DIAGNOSIS — N179 Acute kidney failure, unspecified: Secondary | ICD-10-CM | POA: Diagnosis not present

## 2017-12-09 DIAGNOSIS — I129 Hypertensive chronic kidney disease with stage 1 through stage 4 chronic kidney disease, or unspecified chronic kidney disease: Secondary | ICD-10-CM | POA: Diagnosis present

## 2017-12-09 DIAGNOSIS — R1030 Lower abdominal pain, unspecified: Secondary | ICD-10-CM | POA: Diagnosis not present

## 2017-12-09 DIAGNOSIS — Z79891 Long term (current) use of opiate analgesic: Secondary | ICD-10-CM | POA: Diagnosis not present

## 2017-12-09 DIAGNOSIS — I10 Essential (primary) hypertension: Secondary | ICD-10-CM | POA: Diagnosis not present

## 2017-12-09 DIAGNOSIS — Z6841 Body Mass Index (BMI) 40.0 and over, adult: Secondary | ICD-10-CM | POA: Diagnosis not present

## 2017-12-19 DIAGNOSIS — N201 Calculus of ureter: Secondary | ICD-10-CM | POA: Diagnosis not present

## 2017-12-25 DIAGNOSIS — Z9049 Acquired absence of other specified parts of digestive tract: Secondary | ICD-10-CM | POA: Diagnosis not present

## 2017-12-25 DIAGNOSIS — Z960 Presence of urogenital implants: Secondary | ICD-10-CM | POA: Diagnosis not present

## 2017-12-25 DIAGNOSIS — G629 Polyneuropathy, unspecified: Secondary | ICD-10-CM | POA: Diagnosis not present

## 2017-12-25 DIAGNOSIS — M1990 Unspecified osteoarthritis, unspecified site: Secondary | ICD-10-CM | POA: Diagnosis not present

## 2017-12-25 DIAGNOSIS — Z7952 Long term (current) use of systemic steroids: Secondary | ICD-10-CM | POA: Diagnosis not present

## 2017-12-25 DIAGNOSIS — Z6841 Body Mass Index (BMI) 40.0 and over, adult: Secondary | ICD-10-CM | POA: Diagnosis not present

## 2017-12-25 DIAGNOSIS — Z87891 Personal history of nicotine dependence: Secondary | ICD-10-CM | POA: Diagnosis not present

## 2017-12-25 DIAGNOSIS — Z79899 Other long term (current) drug therapy: Secondary | ICD-10-CM | POA: Diagnosis not present

## 2017-12-25 DIAGNOSIS — N201 Calculus of ureter: Secondary | ICD-10-CM | POA: Diagnosis not present

## 2017-12-25 DIAGNOSIS — Z933 Colostomy status: Secondary | ICD-10-CM | POA: Diagnosis not present

## 2017-12-25 DIAGNOSIS — Z87440 Personal history of urinary (tract) infections: Secondary | ICD-10-CM | POA: Diagnosis not present

## 2017-12-25 DIAGNOSIS — Z90710 Acquired absence of both cervix and uterus: Secondary | ICD-10-CM | POA: Diagnosis not present

## 2017-12-25 DIAGNOSIS — I10 Essential (primary) hypertension: Secondary | ICD-10-CM | POA: Diagnosis not present

## 2017-12-25 DIAGNOSIS — K5090 Crohn's disease, unspecified, without complications: Secondary | ICD-10-CM | POA: Diagnosis not present

## 2018-01-01 DIAGNOSIS — M1990 Unspecified osteoarthritis, unspecified site: Secondary | ICD-10-CM | POA: Diagnosis not present

## 2018-01-01 DIAGNOSIS — N132 Hydronephrosis with renal and ureteral calculous obstruction: Secondary | ICD-10-CM | POA: Diagnosis not present

## 2018-01-01 DIAGNOSIS — Z960 Presence of urogenital implants: Secondary | ICD-10-CM | POA: Diagnosis not present

## 2018-01-01 DIAGNOSIS — Z87440 Personal history of urinary (tract) infections: Secondary | ICD-10-CM | POA: Diagnosis not present

## 2018-01-01 DIAGNOSIS — N201 Calculus of ureter: Secondary | ICD-10-CM | POA: Diagnosis not present

## 2018-01-01 DIAGNOSIS — G629 Polyneuropathy, unspecified: Secondary | ICD-10-CM | POA: Diagnosis not present

## 2018-01-01 DIAGNOSIS — K5090 Crohn's disease, unspecified, without complications: Secondary | ICD-10-CM | POA: Diagnosis not present

## 2018-01-01 DIAGNOSIS — I10 Essential (primary) hypertension: Secondary | ICD-10-CM | POA: Diagnosis not present

## 2018-02-10 DIAGNOSIS — K5010 Crohn's disease of large intestine without complications: Secondary | ICD-10-CM | POA: Diagnosis not present

## 2018-02-10 DIAGNOSIS — M5412 Radiculopathy, cervical region: Secondary | ICD-10-CM | POA: Diagnosis not present

## 2018-02-10 DIAGNOSIS — M130 Polyarthritis, unspecified: Secondary | ICD-10-CM | POA: Diagnosis not present

## 2018-02-11 ENCOUNTER — Ambulatory Visit (INDEPENDENT_AMBULATORY_CARE_PROVIDER_SITE_OTHER): Payer: Medicare Other | Admitting: Internal Medicine

## 2018-02-16 DIAGNOSIS — R600 Localized edema: Secondary | ICD-10-CM | POA: Diagnosis not present

## 2018-02-16 DIAGNOSIS — Z0000 Encounter for general adult medical examination without abnormal findings: Secondary | ICD-10-CM | POA: Diagnosis not present

## 2018-02-16 DIAGNOSIS — I10 Essential (primary) hypertension: Secondary | ICD-10-CM | POA: Diagnosis not present

## 2018-02-16 DIAGNOSIS — I871 Compression of vein: Secondary | ICD-10-CM | POA: Diagnosis not present

## 2018-02-18 DIAGNOSIS — Z9049 Acquired absence of other specified parts of digestive tract: Secondary | ICD-10-CM | POA: Diagnosis not present

## 2018-02-18 DIAGNOSIS — Z7952 Long term (current) use of systemic steroids: Secondary | ICD-10-CM | POA: Diagnosis not present

## 2018-02-18 DIAGNOSIS — J09X2 Influenza due to identified novel influenza A virus with other respiratory manifestations: Secondary | ICD-10-CM | POA: Diagnosis not present

## 2018-02-18 DIAGNOSIS — Z888 Allergy status to other drugs, medicaments and biological substances status: Secondary | ICD-10-CM | POA: Diagnosis not present

## 2018-02-18 DIAGNOSIS — Z90710 Acquired absence of both cervix and uterus: Secondary | ICD-10-CM | POA: Diagnosis not present

## 2018-02-18 DIAGNOSIS — Z87442 Personal history of urinary calculi: Secondary | ICD-10-CM | POA: Diagnosis not present

## 2018-02-18 DIAGNOSIS — K5090 Crohn's disease, unspecified, without complications: Secondary | ICD-10-CM | POA: Diagnosis present

## 2018-02-18 DIAGNOSIS — J101 Influenza due to other identified influenza virus with other respiratory manifestations: Secondary | ICD-10-CM | POA: Diagnosis present

## 2018-02-18 DIAGNOSIS — N1339 Other hydronephrosis: Secondary | ICD-10-CM | POA: Diagnosis not present

## 2018-02-18 DIAGNOSIS — N132 Hydronephrosis with renal and ureteral calculous obstruction: Secondary | ICD-10-CM | POA: Diagnosis present

## 2018-02-18 DIAGNOSIS — N201 Calculus of ureter: Secondary | ICD-10-CM | POA: Diagnosis not present

## 2018-02-18 DIAGNOSIS — I10 Essential (primary) hypertension: Secondary | ICD-10-CM | POA: Diagnosis present

## 2018-02-18 DIAGNOSIS — Z713 Dietary counseling and surveillance: Secondary | ICD-10-CM | POA: Diagnosis not present

## 2018-02-18 DIAGNOSIS — Z87891 Personal history of nicotine dependence: Secondary | ICD-10-CM | POA: Diagnosis not present

## 2018-02-18 DIAGNOSIS — R0600 Dyspnea, unspecified: Secondary | ICD-10-CM | POA: Diagnosis not present

## 2018-02-18 DIAGNOSIS — Z933 Colostomy status: Secondary | ICD-10-CM | POA: Diagnosis not present

## 2018-02-18 DIAGNOSIS — R0902 Hypoxemia: Secondary | ICD-10-CM | POA: Diagnosis not present

## 2018-02-18 DIAGNOSIS — R069 Unspecified abnormalities of breathing: Secondary | ICD-10-CM | POA: Diagnosis not present

## 2018-02-18 DIAGNOSIS — R509 Fever, unspecified: Secondary | ICD-10-CM | POA: Diagnosis not present

## 2018-02-18 DIAGNOSIS — Z6841 Body Mass Index (BMI) 40.0 and over, adult: Secondary | ICD-10-CM | POA: Diagnosis not present

## 2018-02-18 DIAGNOSIS — J40 Bronchitis, not specified as acute or chronic: Secondary | ICD-10-CM | POA: Diagnosis not present

## 2018-02-18 DIAGNOSIS — R5383 Other fatigue: Secondary | ICD-10-CM | POA: Diagnosis present

## 2018-02-18 DIAGNOSIS — J9601 Acute respiratory failure with hypoxia: Secondary | ICD-10-CM | POA: Diagnosis not present

## 2018-02-18 DIAGNOSIS — Z79899 Other long term (current) drug therapy: Secondary | ICD-10-CM | POA: Diagnosis not present

## 2018-02-26 DIAGNOSIS — M1991 Primary osteoarthritis, unspecified site: Secondary | ICD-10-CM | POA: Diagnosis not present

## 2018-02-26 DIAGNOSIS — I10 Essential (primary) hypertension: Secondary | ICD-10-CM | POA: Diagnosis not present

## 2018-03-17 DIAGNOSIS — I351 Nonrheumatic aortic (valve) insufficiency: Secondary | ICD-10-CM | POA: Diagnosis not present

## 2018-03-17 DIAGNOSIS — J9600 Acute respiratory failure, unspecified whether with hypoxia or hypercapnia: Secondary | ICD-10-CM | POA: Diagnosis not present

## 2018-03-17 DIAGNOSIS — J449 Chronic obstructive pulmonary disease, unspecified: Secondary | ICD-10-CM | POA: Diagnosis not present

## 2018-03-17 DIAGNOSIS — I871 Compression of vein: Secondary | ICD-10-CM | POA: Diagnosis not present

## 2018-03-17 DIAGNOSIS — I10 Essential (primary) hypertension: Secondary | ICD-10-CM | POA: Diagnosis not present

## 2018-03-17 DIAGNOSIS — R600 Localized edema: Secondary | ICD-10-CM | POA: Diagnosis not present

## 2018-03-24 DIAGNOSIS — K50119 Crohn's disease of large intestine with unspecified complications: Secondary | ICD-10-CM | POA: Diagnosis not present

## 2018-03-24 DIAGNOSIS — R0602 Shortness of breath: Secondary | ICD-10-CM | POA: Diagnosis not present

## 2018-03-24 DIAGNOSIS — J449 Chronic obstructive pulmonary disease, unspecified: Secondary | ICD-10-CM | POA: Diagnosis not present

## 2018-03-24 DIAGNOSIS — Z87891 Personal history of nicotine dependence: Secondary | ICD-10-CM | POA: Diagnosis not present

## 2018-03-24 DIAGNOSIS — I871 Compression of vein: Secondary | ICD-10-CM | POA: Diagnosis not present

## 2018-03-24 DIAGNOSIS — J9611 Chronic respiratory failure with hypoxia: Secondary | ICD-10-CM | POA: Diagnosis not present

## 2018-03-24 DIAGNOSIS — Z6841 Body Mass Index (BMI) 40.0 and over, adult: Secondary | ICD-10-CM | POA: Diagnosis not present

## 2018-03-31 ENCOUNTER — Other Ambulatory Visit (INDEPENDENT_AMBULATORY_CARE_PROVIDER_SITE_OTHER): Payer: Self-pay | Admitting: Internal Medicine

## 2018-03-31 DIAGNOSIS — K5000 Crohn's disease of small intestine without complications: Secondary | ICD-10-CM

## 2018-04-06 DIAGNOSIS — J449 Chronic obstructive pulmonary disease, unspecified: Secondary | ICD-10-CM | POA: Diagnosis not present

## 2018-04-09 DIAGNOSIS — M5412 Radiculopathy, cervical region: Secondary | ICD-10-CM | POA: Diagnosis not present

## 2018-04-09 DIAGNOSIS — M130 Polyarthritis, unspecified: Secondary | ICD-10-CM | POA: Diagnosis not present

## 2018-04-09 DIAGNOSIS — K5010 Crohn's disease of large intestine without complications: Secondary | ICD-10-CM | POA: Diagnosis not present

## 2018-04-21 DIAGNOSIS — Z87891 Personal history of nicotine dependence: Secondary | ICD-10-CM | POA: Diagnosis not present

## 2018-04-21 DIAGNOSIS — J9611 Chronic respiratory failure with hypoxia: Secondary | ICD-10-CM | POA: Diagnosis not present

## 2018-04-21 DIAGNOSIS — K50119 Crohn's disease of large intestine with unspecified complications: Secondary | ICD-10-CM | POA: Diagnosis not present

## 2018-04-21 DIAGNOSIS — J449 Chronic obstructive pulmonary disease, unspecified: Secondary | ICD-10-CM | POA: Diagnosis not present

## 2018-04-21 DIAGNOSIS — I871 Compression of vein: Secondary | ICD-10-CM | POA: Diagnosis not present

## 2018-04-21 DIAGNOSIS — R0602 Shortness of breath: Secondary | ICD-10-CM | POA: Diagnosis not present

## 2018-04-21 DIAGNOSIS — Z6841 Body Mass Index (BMI) 40.0 and over, adult: Secondary | ICD-10-CM | POA: Diagnosis not present

## 2018-04-22 DIAGNOSIS — H5213 Myopia, bilateral: Secondary | ICD-10-CM | POA: Diagnosis not present

## 2018-04-22 DIAGNOSIS — H2513 Age-related nuclear cataract, bilateral: Secondary | ICD-10-CM | POA: Diagnosis not present

## 2018-04-22 DIAGNOSIS — H04123 Dry eye syndrome of bilateral lacrimal glands: Secondary | ICD-10-CM | POA: Diagnosis not present

## 2018-06-04 DIAGNOSIS — K5010 Crohn's disease of large intestine without complications: Secondary | ICD-10-CM | POA: Diagnosis not present

## 2018-06-04 DIAGNOSIS — M130 Polyarthritis, unspecified: Secondary | ICD-10-CM | POA: Diagnosis not present

## 2018-06-04 DIAGNOSIS — M5412 Radiculopathy, cervical region: Secondary | ICD-10-CM | POA: Diagnosis not present

## 2018-06-04 DIAGNOSIS — Z79899 Other long term (current) drug therapy: Secondary | ICD-10-CM | POA: Diagnosis not present

## 2018-06-08 ENCOUNTER — Other Ambulatory Visit (INDEPENDENT_AMBULATORY_CARE_PROVIDER_SITE_OTHER): Payer: Self-pay | Admitting: Internal Medicine

## 2018-06-08 DIAGNOSIS — K5000 Crohn's disease of small intestine without complications: Secondary | ICD-10-CM

## 2018-07-20 DIAGNOSIS — M2550 Pain in unspecified joint: Secondary | ICD-10-CM | POA: Diagnosis not present

## 2018-07-20 DIAGNOSIS — R7309 Other abnormal glucose: Secondary | ICD-10-CM | POA: Diagnosis not present

## 2018-07-20 DIAGNOSIS — Z136 Encounter for screening for cardiovascular disorders: Secondary | ICD-10-CM | POA: Diagnosis not present

## 2018-07-20 DIAGNOSIS — K5090 Crohn's disease, unspecified, without complications: Secondary | ICD-10-CM | POA: Diagnosis not present

## 2018-07-20 DIAGNOSIS — J189 Pneumonia, unspecified organism: Secondary | ICD-10-CM | POA: Diagnosis not present

## 2018-07-20 DIAGNOSIS — R5383 Other fatigue: Secondary | ICD-10-CM | POA: Diagnosis not present

## 2018-07-21 ENCOUNTER — Ambulatory Visit (INDEPENDENT_AMBULATORY_CARE_PROVIDER_SITE_OTHER): Payer: Medicare Other | Admitting: Internal Medicine

## 2018-08-06 ENCOUNTER — Other Ambulatory Visit (INDEPENDENT_AMBULATORY_CARE_PROVIDER_SITE_OTHER): Payer: Self-pay

## 2018-08-06 DIAGNOSIS — K5000 Crohn's disease of small intestine without complications: Secondary | ICD-10-CM

## 2018-08-12 ENCOUNTER — Other Ambulatory Visit (INDEPENDENT_AMBULATORY_CARE_PROVIDER_SITE_OTHER): Payer: Self-pay | Admitting: Internal Medicine

## 2018-08-12 DIAGNOSIS — K5000 Crohn's disease of small intestine without complications: Secondary | ICD-10-CM

## 2018-08-13 ENCOUNTER — Other Ambulatory Visit (INDEPENDENT_AMBULATORY_CARE_PROVIDER_SITE_OTHER): Payer: Self-pay | Admitting: Internal Medicine

## 2018-08-13 DIAGNOSIS — M130 Polyarthritis, unspecified: Secondary | ICD-10-CM | POA: Diagnosis not present

## 2018-08-13 DIAGNOSIS — K5010 Crohn's disease of large intestine without complications: Secondary | ICD-10-CM | POA: Diagnosis not present

## 2018-08-13 DIAGNOSIS — M5412 Radiculopathy, cervical region: Secondary | ICD-10-CM | POA: Diagnosis not present

## 2018-08-19 ENCOUNTER — Ambulatory Visit (INDEPENDENT_AMBULATORY_CARE_PROVIDER_SITE_OTHER): Payer: Medicare Other | Admitting: Internal Medicine

## 2018-08-19 ENCOUNTER — Encounter (INDEPENDENT_AMBULATORY_CARE_PROVIDER_SITE_OTHER): Payer: Self-pay | Admitting: Internal Medicine

## 2018-08-19 VITALS — BP 122/76 | HR 82 | Resp 18 | Ht 66.0 in | Wt 282.1 lb

## 2018-08-19 DIAGNOSIS — K5000 Crohn's disease of small intestine without complications: Secondary | ICD-10-CM

## 2018-08-19 MED ORDER — PREDNISONE 10 MG PO TABS: ORAL_TABLET | ORAL | 3 refills | Status: DC

## 2018-08-19 NOTE — Patient Instructions (Addendum)
OV in 1 year.  

## 2018-08-19 NOTE — Progress Notes (Addendum)
   Subjective:    Patient ID: Kiara Welch, female    DOB: 18-Mar-1956, 62 y.o.   MRN: [id number] Wt in March of 2018 was 258. Today her weight is 282.  HPI Here today for f/u. Last seen in March of 2018. Hx of small bowel Crohn's disease.  Diagnosed when she was 73.  Has a colostomy.  She stopped the Sandostatin. She said she really didn't think it was helping. Her appetite is okay.  She has gained from 258 to 282. She empties her colonoscopy bag 5-10 times a days.  No rash. She tells me she is a diabetic now. She is trying to change her diet. Her HA1C was a 7 in July of this year.  She is taking Prednisone 37m daily.   07/20/2018 Sedrate 15. WBC 11.5, H and H 14.6 and 45.6  Review of Systems Past Medical History:  Diagnosis Date  . [place]   . Shortness of breath   . SVC syndrome     Past Surgical History:  Procedure Laterality Date  . ANGIOPLASTY     Superior vena cava (balloon) 07/06/2014  . COLON SURGERY     crohn disease  . COLON SURGERY     ostomy revision. Fistula repaired  . IR GENERIC HISTORICAL  09/09/2016   IR VENOCAVAGRAM SVC 09/09/2016 GAletta Edouard MD [facility]  . IR GENERIC HISTORICAL  09/09/2016   IR PTA VENOUS EXCEPT DIALYSIS CIRCUIT 09/09/2016 GAletta Edouard MD [facility]  . IR GENERIC HISTORICAL  09/09/2016   IR [place] ACCESS RIGHT 09/09/2016 GAletta Edouard MD [facility]  . IR PTA VENOUS EXCEPT DIALYSIS CIRCUIT  11/11/2017  . IR [place] V[place] ACCESS RIGHT  11/11/2017  . IR VENOCAVAGRAM SVC  11/11/2017    Allergies  Allergen Reactions  . Remicade [Infliximab] Nausea And Vomiting    REACTION: Back pain    Current Outpatient Medications on File Prior to Visit  Medication Sig Dispense Refill  . acetaminophen (TYLENOL) 500 MG tablet Take 500 mg by mouth every 6 (six) hours as needed for mild pain or moderate pain.     . diphenhydrAMINE (BENADRYL) 25 MG tablet Take 25 mg by mouth every 6 (six) hours as needed for allergies.       .Marland KitchenoxyCODONE-acetaminophen (PERCOCET) 7.5-325 MG tablet Take 1 tablet by mouth every 8 (eight) hours as needed for severe pain.     No current facility-administered medications on file prior to visit.         Objective:   Physical Exam  Vitals:   08/19/18 1523  Weight: 282 lb 1.6 oz (128 kg)  Height: 5' 6"  (1.676 m)    Alert and oriented. Skin warm and dry. Oral mucosa is moist.   . Sclera anicteric, conjunctivae is pink. Thyroid not enlarged. No cervical lymphadenopathy. Lungs clear. Heart regular rate and rhythm.  Abdomen is soft. Bowel sounds are positive. No hepatomegaly. No abdominal masses felt. No tenderness.  No edema to lower extremities.    Morbidly obese. Colostomy bag noted      Assessment & Plan:  Crohns disease of small intestine. Will get her labs from her PCP. Further recommendations to follow.

## 2018-08-27 ENCOUNTER — Encounter (INDEPENDENT_AMBULATORY_CARE_PROVIDER_SITE_OTHER): Payer: Self-pay

## 2018-09-10 DIAGNOSIS — K5090 Crohn's disease, unspecified, without complications: Secondary | ICD-10-CM | POA: Diagnosis not present

## 2018-09-10 DIAGNOSIS — R944 Abnormal results of kidney function studies: Secondary | ICD-10-CM | POA: Diagnosis not present

## 2018-09-11 DIAGNOSIS — E669 Obesity, unspecified: Secondary | ICD-10-CM | POA: Diagnosis not present

## 2018-09-11 DIAGNOSIS — K5090 Crohn's disease, unspecified, without complications: Secondary | ICD-10-CM | POA: Diagnosis not present

## 2018-09-11 DIAGNOSIS — Z87891 Personal history of nicotine dependence: Secondary | ICD-10-CM | POA: Diagnosis not present

## 2018-09-11 DIAGNOSIS — M1991 Primary osteoarthritis, unspecified site: Secondary | ICD-10-CM | POA: Diagnosis not present

## 2018-09-11 DIAGNOSIS — Z6841 Body Mass Index (BMI) 40.0 and over, adult: Secondary | ICD-10-CM | POA: Diagnosis not present

## 2018-09-11 DIAGNOSIS — Z933 Colostomy status: Secondary | ICD-10-CM | POA: Diagnosis not present

## 2018-09-11 DIAGNOSIS — J449 Chronic obstructive pulmonary disease, unspecified: Secondary | ICD-10-CM | POA: Diagnosis not present

## 2018-09-11 DIAGNOSIS — M5412 Radiculopathy, cervical region: Secondary | ICD-10-CM | POA: Diagnosis not present

## 2018-09-11 DIAGNOSIS — I871 Compression of vein: Secondary | ICD-10-CM | POA: Diagnosis not present

## 2018-09-11 DIAGNOSIS — L88 Pyoderma gangrenosum: Secondary | ICD-10-CM | POA: Diagnosis not present

## 2018-09-11 DIAGNOSIS — Z9981 Dependence on supplemental oxygen: Secondary | ICD-10-CM | POA: Diagnosis not present

## 2018-09-16 DIAGNOSIS — I871 Compression of vein: Secondary | ICD-10-CM | POA: Diagnosis not present

## 2018-09-16 DIAGNOSIS — L88 Pyoderma gangrenosum: Secondary | ICD-10-CM | POA: Diagnosis not present

## 2018-09-16 DIAGNOSIS — J449 Chronic obstructive pulmonary disease, unspecified: Secondary | ICD-10-CM | POA: Diagnosis not present

## 2018-09-16 DIAGNOSIS — M1991 Primary osteoarthritis, unspecified site: Secondary | ICD-10-CM | POA: Diagnosis not present

## 2018-09-16 DIAGNOSIS — M5412 Radiculopathy, cervical region: Secondary | ICD-10-CM | POA: Diagnosis not present

## 2018-09-16 DIAGNOSIS — K5090 Crohn's disease, unspecified, without complications: Secondary | ICD-10-CM | POA: Diagnosis not present

## 2018-09-18 DIAGNOSIS — M1991 Primary osteoarthritis, unspecified site: Secondary | ICD-10-CM | POA: Diagnosis not present

## 2018-09-18 DIAGNOSIS — L88 Pyoderma gangrenosum: Secondary | ICD-10-CM | POA: Diagnosis not present

## 2018-09-18 DIAGNOSIS — M5412 Radiculopathy, cervical region: Secondary | ICD-10-CM | POA: Diagnosis not present

## 2018-09-18 DIAGNOSIS — J449 Chronic obstructive pulmonary disease, unspecified: Secondary | ICD-10-CM | POA: Diagnosis not present

## 2018-09-18 DIAGNOSIS — K5090 Crohn's disease, unspecified, without complications: Secondary | ICD-10-CM | POA: Diagnosis not present

## 2018-09-18 DIAGNOSIS — I871 Compression of vein: Secondary | ICD-10-CM | POA: Diagnosis not present

## 2018-09-22 DIAGNOSIS — L88 Pyoderma gangrenosum: Secondary | ICD-10-CM | POA: Diagnosis not present

## 2018-09-22 DIAGNOSIS — J449 Chronic obstructive pulmonary disease, unspecified: Secondary | ICD-10-CM | POA: Diagnosis not present

## 2018-09-22 DIAGNOSIS — M5412 Radiculopathy, cervical region: Secondary | ICD-10-CM | POA: Diagnosis not present

## 2018-09-22 DIAGNOSIS — K5090 Crohn's disease, unspecified, without complications: Secondary | ICD-10-CM | POA: Diagnosis not present

## 2018-09-22 DIAGNOSIS — M1991 Primary osteoarthritis, unspecified site: Secondary | ICD-10-CM | POA: Diagnosis not present

## 2018-09-22 DIAGNOSIS — I871 Compression of vein: Secondary | ICD-10-CM | POA: Diagnosis not present

## 2018-09-25 DIAGNOSIS — M5412 Radiculopathy, cervical region: Secondary | ICD-10-CM | POA: Diagnosis not present

## 2018-09-25 DIAGNOSIS — I871 Compression of vein: Secondary | ICD-10-CM | POA: Diagnosis not present

## 2018-09-25 DIAGNOSIS — M1991 Primary osteoarthritis, unspecified site: Secondary | ICD-10-CM | POA: Diagnosis not present

## 2018-09-25 DIAGNOSIS — L88 Pyoderma gangrenosum: Secondary | ICD-10-CM | POA: Diagnosis not present

## 2018-09-25 DIAGNOSIS — J449 Chronic obstructive pulmonary disease, unspecified: Secondary | ICD-10-CM | POA: Diagnosis not present

## 2018-09-25 DIAGNOSIS — K5090 Crohn's disease, unspecified, without complications: Secondary | ICD-10-CM | POA: Diagnosis not present

## 2018-09-30 DIAGNOSIS — I871 Compression of vein: Secondary | ICD-10-CM | POA: Diagnosis not present

## 2018-09-30 DIAGNOSIS — L88 Pyoderma gangrenosum: Secondary | ICD-10-CM | POA: Diagnosis not present

## 2018-09-30 DIAGNOSIS — K5090 Crohn's disease, unspecified, without complications: Secondary | ICD-10-CM | POA: Diagnosis not present

## 2018-09-30 DIAGNOSIS — M5412 Radiculopathy, cervical region: Secondary | ICD-10-CM | POA: Diagnosis not present

## 2018-09-30 DIAGNOSIS — M1991 Primary osteoarthritis, unspecified site: Secondary | ICD-10-CM | POA: Diagnosis not present

## 2018-09-30 DIAGNOSIS — J449 Chronic obstructive pulmonary disease, unspecified: Secondary | ICD-10-CM | POA: Diagnosis not present

## 2018-10-06 DIAGNOSIS — K5010 Crohn's disease of large intestine without complications: Secondary | ICD-10-CM | POA: Diagnosis not present

## 2018-10-06 DIAGNOSIS — M130 Polyarthritis, unspecified: Secondary | ICD-10-CM | POA: Diagnosis not present

## 2018-10-06 DIAGNOSIS — M5412 Radiculopathy, cervical region: Secondary | ICD-10-CM | POA: Diagnosis not present

## 2018-10-07 DIAGNOSIS — L88 Pyoderma gangrenosum: Secondary | ICD-10-CM | POA: Diagnosis not present

## 2018-10-07 DIAGNOSIS — J449 Chronic obstructive pulmonary disease, unspecified: Secondary | ICD-10-CM | POA: Diagnosis not present

## 2018-10-07 DIAGNOSIS — M1991 Primary osteoarthritis, unspecified site: Secondary | ICD-10-CM | POA: Diagnosis not present

## 2018-10-07 DIAGNOSIS — K5090 Crohn's disease, unspecified, without complications: Secondary | ICD-10-CM | POA: Diagnosis not present

## 2018-10-07 DIAGNOSIS — M5412 Radiculopathy, cervical region: Secondary | ICD-10-CM | POA: Diagnosis not present

## 2018-10-07 DIAGNOSIS — I871 Compression of vein: Secondary | ICD-10-CM | POA: Diagnosis not present

## 2018-10-14 DIAGNOSIS — I871 Compression of vein: Secondary | ICD-10-CM | POA: Diagnosis not present

## 2018-10-14 DIAGNOSIS — K5090 Crohn's disease, unspecified, without complications: Secondary | ICD-10-CM | POA: Diagnosis not present

## 2018-10-14 DIAGNOSIS — M5412 Radiculopathy, cervical region: Secondary | ICD-10-CM | POA: Diagnosis not present

## 2018-10-14 DIAGNOSIS — M1991 Primary osteoarthritis, unspecified site: Secondary | ICD-10-CM | POA: Diagnosis not present

## 2018-10-14 DIAGNOSIS — J449 Chronic obstructive pulmonary disease, unspecified: Secondary | ICD-10-CM | POA: Diagnosis not present

## 2018-10-14 DIAGNOSIS — L88 Pyoderma gangrenosum: Secondary | ICD-10-CM | POA: Diagnosis not present

## 2018-10-23 DIAGNOSIS — E119 Type 2 diabetes mellitus without complications: Secondary | ICD-10-CM | POA: Diagnosis not present

## 2018-10-23 DIAGNOSIS — I871 Compression of vein: Secondary | ICD-10-CM | POA: Diagnosis not present

## 2018-10-23 DIAGNOSIS — J449 Chronic obstructive pulmonary disease, unspecified: Secondary | ICD-10-CM | POA: Diagnosis not present

## 2018-10-23 DIAGNOSIS — K5090 Crohn's disease, unspecified, without complications: Secondary | ICD-10-CM | POA: Diagnosis not present

## 2018-10-27 DIAGNOSIS — L88 Pyoderma gangrenosum: Secondary | ICD-10-CM | POA: Diagnosis not present

## 2018-10-27 DIAGNOSIS — J449 Chronic obstructive pulmonary disease, unspecified: Secondary | ICD-10-CM | POA: Diagnosis not present

## 2018-10-27 DIAGNOSIS — I871 Compression of vein: Secondary | ICD-10-CM | POA: Diagnosis not present

## 2018-10-27 DIAGNOSIS — K5090 Crohn's disease, unspecified, without complications: Secondary | ICD-10-CM | POA: Diagnosis not present

## 2018-10-27 DIAGNOSIS — M1991 Primary osteoarthritis, unspecified site: Secondary | ICD-10-CM | POA: Diagnosis not present

## 2018-10-27 DIAGNOSIS — M5412 Radiculopathy, cervical region: Secondary | ICD-10-CM | POA: Diagnosis not present

## 2018-12-09 DIAGNOSIS — M130 Polyarthritis, unspecified: Secondary | ICD-10-CM | POA: Diagnosis not present

## 2018-12-09 DIAGNOSIS — K5010 Crohn's disease of large intestine without complications: Secondary | ICD-10-CM | POA: Diagnosis not present

## 2018-12-09 DIAGNOSIS — M5412 Radiculopathy, cervical region: Secondary | ICD-10-CM | POA: Diagnosis not present

## 2019-06-10 ENCOUNTER — Other Ambulatory Visit (HOSPITAL_COMMUNITY): Payer: Self-pay | Admitting: Interventional Radiology

## 2019-06-10 DIAGNOSIS — I871 Compression of vein: Secondary | ICD-10-CM

## 2019-06-16 ENCOUNTER — Other Ambulatory Visit: Payer: Self-pay | Admitting: Radiology

## 2019-06-18 ENCOUNTER — Other Ambulatory Visit: Payer: Self-pay

## 2019-06-18 ENCOUNTER — Other Ambulatory Visit (HOSPITAL_COMMUNITY): Payer: Self-pay | Admitting: Interventional Radiology

## 2019-06-18 ENCOUNTER — Encounter (HOSPITAL_COMMUNITY): Payer: Self-pay | Admitting: Interventional Radiology

## 2019-06-18 ENCOUNTER — Ambulatory Visit (HOSPITAL_COMMUNITY)
Admission: RE | Admit: 2019-06-18 | Discharge: 2019-06-18 | Disposition: A | Discharge status: Home / Self Care | Payer: Medicare Other | Source: Ambulatory Visit | Attending: Interventional Radiology | Admitting: Interventional Radiology

## 2019-06-18 DIAGNOSIS — Y839 Surgical procedure, unspecified as the cause of abnormal reaction of the patient, or of later complication, without mention of misadventure at the time of the procedure: Secondary | ICD-10-CM | POA: Insufficient documentation

## 2019-06-18 DIAGNOSIS — I871 Compression of vein: Secondary | ICD-10-CM | POA: Diagnosis present

## 2019-06-18 DIAGNOSIS — T82858A Stenosis of vascular prosthetic devices, implants and grafts, initial encounter: Secondary | ICD-10-CM | POA: Insufficient documentation

## 2019-06-18 DIAGNOSIS — Z87891 Personal history of nicotine dependence: Secondary | ICD-10-CM | POA: Insufficient documentation

## 2019-06-18 DIAGNOSIS — K5090 Crohn's disease, unspecified, without complications: Secondary | ICD-10-CM | POA: Insufficient documentation

## 2019-06-18 HISTORY — PX: IR US GUIDE VASC ACCESS RIGHT: IMG2390

## 2019-06-18 HISTORY — PX: IR PTA VENOUS EXCEPT DIALYSIS CIRCUIT: IMG6126

## 2019-06-18 HISTORY — PX: IR VENOCAVAGRAM SVC: IMG679

## 2019-06-18 LAB — BASIC METABOLIC PANEL
Anion gap: 14 (ref 5–15)
BUN: 24 mg/dL — ABNORMAL HIGH (ref 8–23)
CO2: 23 mmol/L (ref 22–32)
Calcium: 9.6 mg/dL (ref 8.9–10.3)
Chloride: 104 mmol/L (ref 98–111)
Creatinine, Ser: 1.25 mg/dL — ABNORMAL HIGH (ref 0.44–1.00)
GFR calc Af Amer: 53 mL/min — ABNORMAL LOW (ref >60)
GFR calc non Af Amer: 46 mL/min — ABNORMAL LOW (ref >60)
Glucose, Bld: 141 mg/dL — ABNORMAL HIGH (ref 70–99)
Potassium: 4.4 mmol/L (ref 3.5–5.1)
Sodium: 141 mmol/L (ref 135–145)

## 2019-06-18 LAB — CBC
HCT: 48.3 % — ABNORMAL HIGH (ref 36.0–46.0)
Hemoglobin: 15 g/dL (ref 12.0–15.0)
MCH: 31.2 pg (ref 26.0–34.0)
MCHC: 31.1 g/dL (ref 30.0–36.0)
MCV: 100.4 fL — ABNORMAL HIGH (ref 80.0–100.0)
Platelets: 211 10*3/uL (ref 150–400)
RBC: 4.81 MIL/uL (ref 3.87–5.11)
RDW: 13.4 % (ref 11.5–15.5)
WBC: 13.6 10*3/uL — ABNORMAL HIGH (ref 4.0–10.5)
nRBC: 0 % (ref 0.0–0.2)

## 2019-06-18 LAB — PROTIME-INR
INR: 0.9 (ref 0.8–1.2)
Prothrombin Time: 12.4 seconds (ref 11.4–15.2)

## 2019-06-18 MED ORDER — KETOROLAC TROMETHAMINE 30 MG/ML IJ SOLN
INTRAMUSCULAR | Status: AC
  Administered 2019-06-18: 30 mg via INTRAVENOUS
  Filled 2019-06-18: qty 1

## 2019-06-18 MED ORDER — MIDAZOLAM HCL 2 MG/2ML IJ SOLN
INTRAMUSCULAR | Status: AC | PRN
  Administered 2019-06-18 (×3): 1 mg via INTRAVENOUS

## 2019-06-18 MED ORDER — FENTANYL CITRATE (PF) 100 MCG/2ML IJ SOLN
INTRAMUSCULAR | Status: AC
  Filled 2019-06-18: qty 4

## 2019-06-18 MED ORDER — SODIUM CHLORIDE 0.9 % IV SOLN
INTRAVENOUS | Status: DC
  Administered 2019-06-18: 09:00:00 via INTRAVENOUS

## 2019-06-18 MED ORDER — LIDOCAINE HCL (PF) 1 % IJ SOLN
INTRAMUSCULAR | Status: AC | PRN
  Administered 2019-06-18: 5 mL

## 2019-06-18 MED ORDER — MIDAZOLAM HCL 2 MG/2ML IJ SOLN
INTRAMUSCULAR | Status: AC
  Filled 2019-06-18: qty 4

## 2019-06-18 MED ORDER — IOHEXOL 300 MG/ML  SOLN: 30.0000 mL | Freq: Once | INTRAMUSCULAR | Status: DC | PRN

## 2019-06-18 MED ORDER — FENTANYL CITRATE (PF) 100 MCG/2ML IJ SOLN
INTRAMUSCULAR | Status: AC | PRN
  Administered 2019-06-18 (×3): 50 ug via INTRAVENOUS

## 2019-06-18 MED ORDER — KETOROLAC TROMETHAMINE 30 MG/ML IJ SOLN
30.0000 mg | Freq: Once | INTRAMUSCULAR | Status: AC
  Administered 2019-06-18: 30 mg via INTRAVENOUS

## 2019-06-18 MED ORDER — LIDOCAINE HCL 1 % IJ SOLN
INTRAMUSCULAR | Status: AC
  Filled 2019-06-18: qty 20

## 2019-06-18 NOTE — H&P (Signed)
Chief Complaint: Patient was seen in consultation today for SVC syndrome   Supervising Physician: Aletta Edouard  Patient Status: [facility] - Out-pt  History of Present Illness: Kiara Welch is a 63 y.o. female with past medical history of recurrent SVC syndrome.  She underwent stent placement 02/2011 and undergone repeat angioplasty several times for restenosis.  She states she recently developed right-sided facial and hand swelling which is consistent with recurrent symptoms for her. She presents today for angioplasty of her SVC/stent with Dr. Kathlene Cote.   She presents in her usual state of health. She has been NPO.  She does not take blood thinners.  She was recently placed on antibiotics for UTI and reports improvement in her symptoms.  She otherwise denies fever, chills, nausea, abdominal pain, cough, shortness of breath, dysuria.   Past Medical History:  Diagnosis Date   Crohn disease ([place])    Shortness of breath    SVC syndrome     Past Surgical History:  Procedure Laterality Date   ANGIOPLASTY     Superior vena cava (balloon) 07/06/2014   COLON SURGERY     crohn disease   COLON SURGERY     ostomy revision. Fistula repaired   IR GENERIC HISTORICAL  09/09/2016   IR VENOCAVAGRAM SVC 09/09/2016 Aletta Edouard, MD [facility]   IR GENERIC HISTORICAL  09/09/2016   IR PTA VENOUS EXCEPT DIALYSIS CIRCUIT 09/09/2016 Aletta Edouard, MD [facility]   IR GENERIC HISTORICAL  09/09/2016   IR US GUIDE VASC ACCESS RIGHT 09/09/2016 Aletta Edouard, MD [facility]   IR PTA VENOUS EXCEPT DIALYSIS CIRCUIT  11/11/2017   IR US GUIDE VASC ACCESS RIGHT  11/11/2017   IR VENOCAVAGRAM SVC  11/11/2017    Allergies: Remicade [infliximab]  Medications: Prior to Admission medications   Medication Sig Start Date End Date Taking? Authorizing Provider  oxyCODONE-acetaminophen (PERCOCET) 7.5-325 MG tablet Take 1 tablet by mouth 3 (three) times daily. Scheduled   Yes [provider]  predniSONE (DELTASONE) 10 MG tablet Take 3 a day Patient taking differently: Take 30 mg by mouth daily. Take 3 a day 08/19/18  Yes Setzer, Rona Ravens, NP     No family history on file.  Social History   Socioeconomic History   Marital status: Divorced    Spouse name: Not on file   Number of children: Not on file   Years of education: Not on file   Highest education level: Not on file  Occupational History   Not on file  Social Needs   Financial resource strain: Not on file   Food insecurity    Worry: Not on file    Inability: Not on file   Transportation needs    Medical: Not on file    Non-medical: Not on file  Tobacco Use   Smoking status: Former Smoker    Packs/day: 0.50    Years: 40.00    Pack years: 20.00    Types: Cigarettes    Quit date: 04/02/2007    Years since quitting: 12.2   Smokeless tobacco: Never Used  Substance and Sexual Activity   Alcohol use: No    Comment: stopped 1997   Drug use: No   Sexual activity: Not on file  Lifestyle   Physical activity    Days per week: Not on file    Minutes per session: Not on file   Stress: Not on file  Relationships   Social connections    Talks on phone: Not on  file    Gets together: Not on file    Attends religious service: Not on file    Active member of club or organization: Not on file    Attends meetings of clubs or organizations: Not on file    Relationship status: Not on file  Other Topics Concern   Not on file  Social History Narrative   Not on file     Review of Systems: A 12 point ROS discussed and pertinent positives are indicated in the HPI above.  All other systems are negative.  Review of Systems  Constitutional: Negative for fatigue and fever.  Respiratory: Negative for cough and shortness of breath.   Cardiovascular: Negative for chest pain.  Gastrointestinal: Negative for abdominal pain, nausea and vomiting.  Musculoskeletal: Negative for back pain.    Psychiatric/Behavioral: Negative for behavioral problems and confusion.    Vital Signs: BP (!) 180/94 (BP Location: Left Arm)    Pulse 90    Temp 98.2 F (36.8 C) (Oral)    Resp 20    Ht 5' 6"  (1.676 m)    Wt 290 lb (131.5 kg)    LMP 04/02/1999    SpO2 99%    BMI 46.81 kg/m   Physical Exam Vitals signs and nursing note reviewed.  Constitutional:      Appearance: Normal appearance.  Cardiovascular:     Rate and Rhythm: Normal rate and regular rhythm.  Pulmonary:     Effort: Pulmonary effort is normal. No respiratory distress.     Breath sounds: Normal breath sounds.  Skin:    General: Skin is warm and dry.  Neurological:     General: No focal deficit present.     Mental Status: She is alert and oriented to person, place, and time.  Psychiatric:        Mood and Affect: Mood normal.        Behavior: Behavior normal.        Thought Content: Thought content normal.        Judgment: Judgment normal.      MD Evaluation Airway: WNL Heart: WNL Abdomen: WNL Chest/ Lungs: WNL ASA  Classification: 3 Mallampati/Airway Score: Three   Imaging: No results found.  Labs:  CBC: Recent Labs    06/18/19 0852  WBC 13.6*  HGB 15.0  HCT 48.3*  PLT 211    COAGS: Recent Labs    06/18/19 0852  INR 0.9    BMP: Recent Labs    06/18/19 0852  NA 141  K 4.4  CL 104  CO2 23  GLUCOSE 141*  BUN 24*  CALCIUM 9.6  CREATININE 1.25*  GFRNONAA 46*  GFRAA 53*    LIVER FUNCTION TESTS: No results for input(s): BILITOT, AST, ALT, ALKPHOS, PROT, ALBUMIN in the last 8760 hours.  TUMOR MARKERS: No results for input(s): AFPTM, CEA, CA199, CHROMGRNA in the last 8760 hours.  Assessment and Plan: Patient with past medical history of SVC syndrome presents with complaint of shortness of breath, right sided swelling.  Patient presents today in their usual state of health for angioplasty.  She has been NPO and is not currently on blood thinners.   Risks and benefits of venogram  with angioplasty were discussed with the patient including, but not limited to bleeding, infection, vascular injury or contrast induced renal failure.  This interventional procedure involves the use of X-rays and because of the nature of the planned procedure, it is possible that we will have prolonged use of X-ray fluoroscopy.  Potential radiation risks to you include (but are not limited to) the following: - A slightly elevated risk for cancer  several years later in life. This risk is typically less than 0.5% percent. This risk is low in comparison to the normal incidence of human cancer, which is 33% for women and 50% for men according to the [place]. - Radiation induced injury can include skin redness, resembling a rash, tissue breakdown / ulcers and hair loss (which can be temporary or permanent).   The likelihood of either of these occurring depends on the difficulty of the procedure and whether you are sensitive to radiation due to previous procedures, disease, or genetic conditions.   IF your procedure requires a prolonged use of radiation, you will be notified and given written instructions for further action.  It is your responsibility to monitor the irradiated area for the 2 weeks following the procedure and to notify your physician if you are concerned that you have suffered a radiation induced injury.    All of the patient's questions were answered, patient is agreeable to proceed.  Consent signed and in chart.  Thank you for this interesting consult.  I greatly enjoyed meeting Kiara Welch and look forward to participating in their care.  A copy of this report was sent to the requesting provider on this date.  Electronically Signed: Docia Barrier, PA 06/18/2019, 10:18 AM   I spent a total of    25 Minutes in face to face in clinical consultation, greater than 50% of which was counseling/coordinating care for SVC syndrome.

## 2019-06-18 NOTE — Discharge Instructions (Addendum)
Angiogram, Care After This sheet gives you information about how to care for yourself after your procedure. Your health care provider may also give you more specific instructions. If you have problems or questions, contact your health care provider. What can I expect after the procedure? After the procedure, it is common to have bruising and tenderness at the catheter insertion area. Follow these instructions at home: Insertion site care  Follow instructions from your health care provider about how to take care of your insertion site. Make sure you: ? Wash your hands with soap and water before you change your bandage (dressing). If soap and water are not available, use hand sanitizer. ? Change your dressing as told by your health care provider. ? Leave stitches (sutures), skin glue, or adhesive strips in place. These skin closures may need to stay in place for 2 weeks or longer. If adhesive strip edges start to loosen and curl up, you may trim the loose edges. Do not remove adhesive strips completely unless your health care provider tells you to do that.  Do not take baths, swim, or use a hot tub until your health care provider approves.  You may shower 24-48 hours after the procedure or as told by your health care provider. ? Gently wash the site with plain soap and water. ? Pat the area dry with a clean towel. ? Do not rub the site. This may cause bleeding.  Do not apply powder or lotion to the site. Keep the site clean and dry.  Check your insertion site every day for signs of infection. Check for: ? Redness, swelling, or pain. ? Fluid or blood. ? Warmth. ? Pus or a bad smell. Activity  Rest as told by your health care provider, usually for 1-2 days.  Do not lift anything that is heavier than 10 lbs. (4.5 kg) or as told by your health care provider.  Do not drive for 24 hours if you were given a medicine to help you relax (sedative).  Do not drive or use heavy machinery while  taking prescription pain medicine. General instructions   Return to your normal activities as told by your health care provider, usually in about a week. Ask your health care provider what activities are safe for you.  If the catheter site starts bleeding, lie flat and put pressure on the site. If the bleeding does not stop, get help right away. This is a medical emergency.  Drink enough fluid to keep your urine clear or pale yellow. This helps flush the contrast dye from your body.  Take over-the-counter and prescription medicines only as told by your health care provider.  Keep all follow-up visits as told by your health care provider. This is important. Contact a health care provider if:  You have a fever or chills.  You have redness, swelling, or pain around your insertion site.  You have fluid or blood coming from your insertion site.  The insertion site feels warm to the touch.  You have pus or a bad smell coming from your insertion site.  You have bruising around the insertion site.  You notice blood collecting in the tissue around the catheter site (hematoma). The hematoma may be painful to the touch. Get help right away if:  You have severe pain at the catheter insertion area.  The catheter insertion area swells very fast.  The catheter insertion area is bleeding, and the bleeding does not stop when you hold steady pressure on the area.  The area near or just beyond the catheter insertion site becomes pale, cool, tingly, or numb. These symptoms may represent a serious problem that is an emergency. Do not wait to see if the symptoms will go away. Get medical help right away. Call your local emergency services (911 in the [place] Do not drive yourself to the hospital. Summary  After the procedure, it is common to have bruising and tenderness at the catheter insertion area.  After the procedure, it is important to rest and drink plenty of fluids.  Do not take baths,  swim, or use a hot tub until your health care provider says it is okay to do so. You may shower 24-48 hours after the procedure or as told by your health care provider.  If the catheter site starts bleeding, lie flat and put pressure on the site. If the bleeding does not stop, get help right away. This is a medical emergency. This information is not intended to replace advice given to you by your health care provider. Make sure you discuss any questions you have with your health care provider. Document Released: 06/27/2005 Document Revised: 11/21/2017 Document Reviewed: 11/13/2016 Elsevier Patient Education  [place]. Moderate Conscious Sedation, Adult, Care After These instructions provide you with information about caring for yourself after your procedure. Your health care provider may also give you more specific instructions. Your treatment has been planned according to current medical practices, but problems sometimes occur. Call your health care provider if you have any problems or questions after your procedure. What can I expect after the procedure? After your procedure, it is common:  To feel sleepy for several hours.  To feel clumsy and have poor balance for several hours.  To have poor judgment for several hours.  To vomit if you eat too soon. Follow these instructions at home: For at least 24 hours after the procedure:   Do not: ? Participate in activities where you could fall or become injured. ? Drive. ? Use heavy machinery. ? Drink alcohol. ? Take sleeping pills or medicines that cause drowsiness. ? Make important decisions or sign legal documents. ? Take care of children on your own.  Rest. Eating and drinking  Follow the diet recommended by your health care provider.  If you vomit: ? Drink water, juice, or soup when you can drink without vomiting. ? Make sure you have little or no nausea before eating solid foods. General instructions  Have a  responsible adult stay with you until you are awake and alert.  Take over-the-counter and prescription medicines only as told by your health care provider.  If you smoke, do not smoke without supervision.  Keep all follow-up visits as told by your health care provider. This is important. Contact a health care provider if:  You keep feeling nauseous or you keep vomiting.  You feel light-headed.  You develop a rash.  You have a fever. Get help right away if:  You have trouble breathing. This information is not intended to replace advice given to you by your health care provider. Make sure you discuss any questions you have with your health care provider. Document Released: 09/29/2013 Document Revised: 05/13/2016 Document Reviewed: 03/30/2016 Elsevier Interactive Patient Education  2019 [employer].

## 2019-06-18 NOTE — Procedures (Signed)
Interventional Radiology Procedure Note  Procedure: SVC venography and angioplasty  Complications: None  Estimated Blood Loss: < 10 mL  Findings: Recurrent SVC stenosis in previously placed stent.  SVC stent dilated with 14 mm Atlas balloon with improved patency and flow.  Venetia Night. Kathlene Cote, M.D Pager:  [phone]

## 2019-07-12 ENCOUNTER — Other Ambulatory Visit (INDEPENDENT_AMBULATORY_CARE_PROVIDER_SITE_OTHER): Payer: Self-pay | Admitting: Internal Medicine

## 2019-07-12 ENCOUNTER — Encounter (INDEPENDENT_AMBULATORY_CARE_PROVIDER_SITE_OTHER): Payer: Self-pay

## 2019-07-12 DIAGNOSIS — K5000 Crohn's disease of small intestine without complications: Secondary | ICD-10-CM

## 2019-08-24 ENCOUNTER — Ambulatory Visit (INDEPENDENT_AMBULATORY_CARE_PROVIDER_SITE_OTHER): Payer: Medicare Other | Admitting: Nurse Practitioner

## 2019-09-13 ENCOUNTER — Ambulatory Visit (INDEPENDENT_AMBULATORY_CARE_PROVIDER_SITE_OTHER): Payer: Medicare Other | Admitting: Nurse Practitioner

## 2019-11-04 ENCOUNTER — Telehealth (INDEPENDENT_AMBULATORY_CARE_PROVIDER_SITE_OTHER): Payer: Self-pay | Admitting: Internal Medicine

## 2019-11-04 ENCOUNTER — Other Ambulatory Visit (INDEPENDENT_AMBULATORY_CARE_PROVIDER_SITE_OTHER): Payer: Self-pay | Admitting: *Deleted

## 2019-11-04 DIAGNOSIS — K5000 Crohn's disease of small intestine without complications: Secondary | ICD-10-CM

## 2019-11-04 NOTE — Telephone Encounter (Signed)
Patient is needing her Prednisone refilled. Last filled by Karna Christmas 07/12/2019. Patient has cancelled 2 appointments for September 2020 , but states that it was because of the COVID 19 virus. She currently has appointment with Dr.Rehman on 03/14/2020. She also states that [facility] would dispense 200 at a time with 2 additional refills. and she is currently taking 30 mg daily.  Last Lab work was completed 03/05/2017.There are orders for 08/19/2018 that the patient did not have completed.

## 2019-11-04 NOTE — Telephone Encounter (Signed)
Patient would like a call back regarding a refill  - ph# [phone]

## 2019-11-04 NOTE — Telephone Encounter (Signed)
Per Dr.Rehman the patient will need appointment with HIM either this month or December. He ask that the patient start with the following dosage. 25 mg by mouth for 1 week , 20 mg by mouth for 1 week , 15 mg by mouth for 1 week , 10 mg by mouth for 1 week , 5 mg by mouth for 1 week. Dispense 100 no refills. She will also need to have the following lab work done. CBC/D , C-Met , CRP. Patient called and made aware. Rx called to the patient's pharmacy.

## 2019-11-04 NOTE — Telephone Encounter (Signed)
Patient now is sharing that she is taking the Prednisone for the Pyoderma [place]ren[place]. States that when she comes off the Prednisone she will get this.

## 2019-12-08 ENCOUNTER — Telehealth (INDEPENDENT_AMBULATORY_CARE_PROVIDER_SITE_OTHER): Payer: Self-pay | Admitting: *Deleted

## 2019-12-08 NOTE — Telephone Encounter (Signed)
Patient was called and made aware that she will need to have a CRP , Sed Rate.  Patent ask that I call her PA at [facility]. I called then at [phone] and left a detailed message.  They were ask ti return a call to [place].  Patient also ask if Dr.Rehman wanted her to continue to titrate her Prednisone? To be addressed, she is currently taking 2.5 mg.

## 2020-01-26 ENCOUNTER — Encounter (INDEPENDENT_AMBULATORY_CARE_PROVIDER_SITE_OTHER): Payer: Self-pay

## 2020-02-03 ENCOUNTER — Encounter (INDEPENDENT_AMBULATORY_CARE_PROVIDER_SITE_OTHER): Payer: Self-pay | Admitting: Internal Medicine

## 2020-02-03 ENCOUNTER — Other Ambulatory Visit: Payer: Self-pay

## 2020-02-03 ENCOUNTER — Ambulatory Visit (INDEPENDENT_AMBULATORY_CARE_PROVIDER_SITE_OTHER): Payer: Medicare Other | Admitting: Internal Medicine

## 2020-02-03 VITALS — BP 131/79 | HR 97 | Temp 97.7°F | Ht 66.0 in | Wt 294.5 lb

## 2020-02-03 DIAGNOSIS — R197 Diarrhea, unspecified: Secondary | ICD-10-CM | POA: Diagnosis not present

## 2020-02-03 DIAGNOSIS — K5080 Crohn's disease of both small and large intestine without complications: Secondary | ICD-10-CM

## 2020-02-03 DIAGNOSIS — Z6841 Body Mass Index (BMI) 40.0 and over, adult: Secondary | ICD-10-CM

## 2020-02-03 DIAGNOSIS — E669 Obesity, unspecified: Secondary | ICD-10-CM | POA: Insufficient documentation

## 2020-02-03 MED ORDER — LOPERAMIDE HCL 2 MG PO CAPS: 2.0000 mg | ORAL_CAPSULE | Freq: Two times a day (BID) | ORAL | 5 refills | Status: AC

## 2020-02-03 NOTE — Progress Notes (Signed)
Presenting complaint;  Follow-up for Crohn's disease.  Database and subjective:  Patient is 64 year old Caucasian female was history of Crohn's disease starting at age 52.  She has disease involving small as well as large bowel including perianal disease with fistulae.  She had done well with methotrexate which she took for several months.  She then developed side effects and discontinue the therapy.  She was unable to tolerate 6-MP.  She has undergone multiple surgeries including for large abscess. Colonoscopy in June 2008 revealed terminal ileitis multiple ulcers with luminal narrowing involving sigmoid and descending colon. 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  She took this parasite for couple of months and and noticed symptomatic improvement but she presented with large abdominal abscess 3 months later.  She had percutaneous drainage.  Fluid was clear but grew E. coli and she was treated with antibiotic.  She was subsequently seen at [facility] had resection of sigmoid colon.  And she ended up with colostomy. At 1 point she was tried on infliximab however it was discontinued because of side effects and there was also question if she developed an abscess.  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  She had angioplasty of superior vena cava and right innominate vein in July 2011. He eventually went on to have stenting in March 2012 and has required multiple dilations. Patient has been on prednisone off and on.  Patient has been seen 3 times in our office in the last 3 years and most recently 18 months ago. Patient called her office on 11/04/2019 requesting a refill on prednisone.  Patient had canceled 2 prior visits.  Patient was given scheduled to taper and come off prednisone and she was advised to have blood work prior to her visit.  Patient was able to come off prednisone.  Last  dose was over 2 weeks ago.  She states back in December she had abdominal pain for 3 to 4 days.  She states her son had bad diarrhea and she thought he had viral infection and she may have contracted it.  She says pain lasted for 3 to 4 days and subsided on its own.  She did not experience nausea vomiting fever or chills. She complains of nausea and poor appetite.  She says she is empty his colostomy bag about 12 times a day.  Mostly it is watery.  She does notice small amount of blood from stoma site when she cleans it.  She previously had been on Sandostatin but she stopped it.  In spite of poor appetite she has gained 12 pounds since her last visit 18 months ago.  She says during one of her hospitalization she was diagnosed with COPD but no therapy was administered.  She also complains of swelling to her feet and hands.  She has exertional dyspnea.  She does not have any rectal discharge.  She only has rectal stump left.  She tells me that Dr. Kathyrn Lass told her that there is not much to reverse her colostomy.  Current Medications: Outpatient Encounter Medications as of 02/03/2020  Medication Sig  . oxyCODONE-acetaminophen (PERCOCET) 7.5-325 MG tablet Take 1 tablet by mouth 3 (three) times daily. Scheduled  . predniSONE (DELTASONE) 10 MG tablet Take 3 tablets by mouth every day (Patient not taking: Reported on 02/03/2020)   No facility-administered encounter medications on file as of 02/03/2020.   Past Medical History:  Diagnosis Date  .  Crohn disease ([place])   . Shortness of breath   . SVC syndrome        Morbid obesity  Past Surgical History:  Procedure Laterality Date  . ANGIOPLASTY     Superior vena cava (balloon) 07/06/2014  . COLON SURGERY     crohn disease  . COLON SURGERY     ostomy revision. Fistula repaired  . IR GENERIC HISTORICAL  09/09/2016   IR VENOCAVAGRAM SVC 09/09/2016 Aletta Edouard, MD [facility]INTER[facility]  . IR GENERIC HISTORICAL  09/09/2016   IR PTA VENOUS EXCEPT DIALYSIS CIRCUIT  09/09/2016 Aletta Edouard, MD [facility]  . IR GENERIC HISTORICAL  09/09/2016   IR US GUIDE VASC ACCESS RIGHT 09/09/2016 Aletta Edouard, MD [facility]  . IR PTA VENOUS EXCEPT DIALYSIS CIRCUIT  11/11/2017  . IR PTA VENOUS EXCEPT DIALYSIS CIRCUIT  06/18/2019  . IR US GUIDE VASC ACCESS RIGHT  11/11/2017  . IR US GUIDE VASC ACCESS RIGHT  06/18/2019  . IR VENOCAVAGRAM SVC  11/11/2017  . IR VENOCAVAGRAM SVC  06/18/2019    Objective: Blood pressure 131/79, pulse 97, temperature 97.7 F (36.5 C), temperature source Temporal, height 5' 6"  (1.676 m), weight 294 lb 8 oz (133.6 kg), last menstrual period 04/02/1999. Patient is alert and in no acute distress. She is wearing facial mask. Conjunctiva is pink. Sclera is nonicteric Oropharyngeal mucosa is normal. No neck masses or thyromegaly noted. Cardiac exam with regular rhythm normal S1 and S2. No murmur or gallop noted. Lungs are clear to auscultation. Abdomen is obese.  She has low midline scar.  Colostomy bag is located in left lower quadrant containing liquid stool.  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 She may have slight clubbing and trace edema around her legs.  Labs/studies Results:  Lab data from 01/18/2020 CRP 3.9(upper limit of normal 1) Sed rate 25 which is normal.  Assessment:  #1.  Patient with over 40-year history of Crohn's disease complicated by multiple abscesses requiring multiple surgeries whose care has been sporadic as she has not been seen in our office on regular basis.  She has been on prednisone for several months and finally able to come off it.  She should not go back on prednisone unless absolutely necessary.  Her disease needs to be restaged and treatment options reviewed. She remains with diarrhea.  She would benefit from low-dose loperamide.  She clearly is not having malabsorption because she continues to gain weight.  #2.  Morbid obesity.  Need to rule out thyroid disease.   Plan:  MR  enterography. Patient will go to the lab for CBC with differential, comprehensive chemistry panel hepatitis B surface antigen, QuantiFERON-TB gold plus. I would also like to check vitamin D bone density and TSH at some point. Loperamide 2 mg p.o. twice daily. Further recommendations to follow.

## 2020-02-03 NOTE — Patient Instructions (Signed)
Physician will call with results of blood test and MRI and make further recommendations

## 2020-02-04 ENCOUNTER — Other Ambulatory Visit (INDEPENDENT_AMBULATORY_CARE_PROVIDER_SITE_OTHER): Payer: Self-pay | Admitting: *Deleted

## 2020-02-04 DIAGNOSIS — Z7952 Long term (current) use of systemic steroids: Secondary | ICD-10-CM

## 2020-02-04 DIAGNOSIS — K5080 Crohn's disease of both small and large intestine without complications: Secondary | ICD-10-CM

## 2020-02-08 ENCOUNTER — Telehealth (INDEPENDENT_AMBULATORY_CARE_PROVIDER_SITE_OTHER): Payer: Self-pay | Admitting: *Deleted

## 2020-02-08 NOTE — Telephone Encounter (Signed)
Please call patient [phone]- she has questions about labs Dr Laural Golden wanted - she states she went to [facility]

## 2020-02-09 NOTE — Telephone Encounter (Signed)
Talked with the patient. She went to the ED on Sunday, because she was having sever scapula pain. She had lab work drawn , Cardiac work up and this was negative. She now has a very bad case of Shingles. It will be next week or so before she can go and get the other labs work that [facility] ordered to include a TB test. She states that she will have them to send [place] all results.

## 2020-03-14 ENCOUNTER — Ambulatory Visit (INDEPENDENT_AMBULATORY_CARE_PROVIDER_SITE_OTHER): Payer: Medicare Other | Admitting: Internal Medicine

## 2020-03-14 ENCOUNTER — Other Ambulatory Visit (INDEPENDENT_AMBULATORY_CARE_PROVIDER_SITE_OTHER): Payer: Self-pay | Admitting: *Deleted

## 2020-03-14 DIAGNOSIS — Z01812 Encounter for preprocedural laboratory examination: Secondary | ICD-10-CM

## 2020-04-04 ENCOUNTER — Encounter: Payer: Self-pay | Admitting: Internal Medicine

## 2020-05-04 ENCOUNTER — Ambulatory Visit (INDEPENDENT_AMBULATORY_CARE_PROVIDER_SITE_OTHER): Payer: Medicare Other | Admitting: Internal Medicine

## 2020-05-04 ENCOUNTER — Other Ambulatory Visit (INDEPENDENT_AMBULATORY_CARE_PROVIDER_SITE_OTHER): Payer: Self-pay | Admitting: *Deleted

## 2020-05-04 ENCOUNTER — Encounter (INDEPENDENT_AMBULATORY_CARE_PROVIDER_SITE_OTHER): Payer: Self-pay | Admitting: *Deleted

## 2020-05-04 ENCOUNTER — Other Ambulatory Visit: Payer: Self-pay

## 2020-05-04 ENCOUNTER — Other Ambulatory Visit (HOSPITAL_COMMUNITY)
Admission: RE | Admit: 2020-05-04 | Discharge: 2020-05-04 | Disposition: A | Discharge status: Home / Self Care | Payer: Medicare Other | Source: Ambulatory Visit | Attending: Internal Medicine | Admitting: Internal Medicine

## 2020-05-04 ENCOUNTER — Encounter (INDEPENDENT_AMBULATORY_CARE_PROVIDER_SITE_OTHER): Payer: Self-pay | Admitting: Internal Medicine

## 2020-05-04 VITALS — BP 180/76 | HR 86 | Temp 97.4°F | Ht 66.0 in | Wt 277.8 lb

## 2020-05-04 DIAGNOSIS — Z01812 Encounter for preprocedural laboratory examination: Secondary | ICD-10-CM

## 2020-05-04 DIAGNOSIS — K5090 Crohn's disease, unspecified, without complications: Secondary | ICD-10-CM | POA: Diagnosis not present

## 2020-05-04 DIAGNOSIS — M0760 Enteropathic arthropathies, unspecified site: Secondary | ICD-10-CM | POA: Diagnosis not present

## 2020-05-04 DIAGNOSIS — E559 Vitamin D deficiency, unspecified: Secondary | ICD-10-CM | POA: Diagnosis not present

## 2020-05-04 DIAGNOSIS — K5080 Crohn's disease of both small and large intestine without complications: Secondary | ICD-10-CM

## 2020-05-04 LAB — CREATININE, SERUM
Creatinine, Ser: 1.12 mg/dL — ABNORMAL HIGH (ref 0.44–1.00)
GFR calc Af Amer: >60 mL/min (ref >60)
GFR calc non Af Amer: 52 mL/min — ABNORMAL LOW (ref >60)

## 2020-05-04 MED ORDER — PREDNISONE 5 MG PO TABS
5.0000 mg | ORAL_TABLET | Freq: Every day | ORAL | 0 refills | Status: DC
Start: 2020-05-04 — End: 2020-08-04

## 2020-05-04 NOTE — Progress Notes (Signed)
Presenting complaint;  Follow-up for Crohn's disease.  Subjective:  Patient is 64 year old Caucasian female with history of Crohn's disease dating back to age 66 with complicated course who spent intolerant of 6-MP and had side effects with infliximab.  Her disease has been complicated by multiple abscesses requiring surgeries and she has a colostomy now.  Patient has been on prednisone for several months.  This was gradually tapered and she has been off it for more than 6 months.  On her last visit of February 2021 she had lab studies.  TB testing was negative and so was hepatitis B surface antigen.  She was scheduled for MR enterography in [facility].  When she went to have the test done she was told that they did not have expertise to do this test.  Therefore the test was not done. She reports she had chills over a week ago and she may have had low-grade fever.  She did not notice change in ostomy output but she did notice a drop in her appetite.  She then spontaneously got better.  She is eating 3 meals a day and she also eats at least one snack a day.  Since her last visit she has lost 17 pounds.  She says she had lost more than 20 pounds but she has gained 3 to 5 pounds back.  She remains with mid abdominal pain that she describes as cramping which is intermittent but not every day.  She feels she has pain with certain foods.  Every time she has pain and if she has a bowel movement she feels better.  At times pain associated with nausea and diaphoresis.  She denies melena or frank rectal bleeding.  She does see blood in her ostomy when she changes the bag.  She complains of polyarthralgia particular in her hands.  She says it is affecting the quality of her life and this has gotten worse since she came off the prednisone.    Current Medications: Outpatient Encounter Medications as of 05/04/2020  Medication Sig  . ergocalciferol (VITAMIN D2) 1.25 MG (50000 UT) capsule ergocalciferol  (vitamin D2) 1,250 mcg (50,000 unit) capsule  Take 1 capsule twice a week by oral route.  . loperamide (IMODIUM) 2 MG capsule Take 1 capsule (2 mg total) by mouth 2 (two) times daily.  Marland Kitchen oxyCODONE-acetaminophen (PERCOCET) 7.5-325 MG tablet Take 1 tablet by mouth 3 (three) times daily. Scheduled  . pregabalin (LYRICA) 100 MG capsule pregabalin 100 mg capsule  Take 1 capsule 3 times a day by oral route for 30 days.  . predniSONE (DELTASONE) 10 MG tablet Take 3 tablets by mouth every day (Patient not taking: Reported on 02/03/2020)   No facility-administered encounter medications on file as of 05/04/2020.     Objective: Blood pressure (!) 180/76, pulse 86, temperature (!) 97.4 F (36.3 C), temperature source Temporal, height 5' 6"  (1.676 m), weight 277 lb 12.8 oz (126 kg), last menstrual period 04/02/1999. Patient is alert and in no acute distress. She is wearing a facial mask. Conjunctiva is injected.. Sclera is nonicteric Oropharyngeal mucosa is normal. No neck masses or thyromegaly noted. Cardiac exam with regular rhythm normal S1 and S2.  She has grade 2/6 systolic murmur best heard at aortic and pulmonary areas. Lungs are clear to auscultation. Abdomen is obese with long midline scar.  She has a colostomy in left lower quadrant.  Bag contains thick liquid stool.  She has mild periumbilical tenderness.  No organomegaly or masses. No LE  edema or clubbing noted.  Labs/studies Results: Lab data from 03/09/2020 WBC 7.40 H&H 14.7 and 47.2 Platelet count 241K  Serum sodium 145, potassium 3.5, chloride 103, CO2 28, BUN 8, creatinine 1.07 Total protein 7.0 and albumin 4.1 Serum calcium 10 Bilirubin 0.7 Alkaline phosphatase 79 AST 40 and ALT 17  Vitamin D level was 6.  TSH 0.593  QuantiFERON TB gold plus negative Hepatitis B surface antigen negative.  Assessment:  #1.  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  Patient presently is on no maintenance therapy.  Disease activity needs to be assessed before treatment recommendations made.  #2.  Polyarthralgias.  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  As she cannot take NSAIDs with letter use low-dose prednisone.  She had bone density study but results are not available.  #3.  Vitamin D deficiency.  Patient was begun on vitamin D.   Plan:  Prednisone 5 mg p.o. daily as needed.  Patient advised to take no more than 5 doses per week if at all possible. We will schedule patient for abdominal pelvic CT with contrast at [facility] as soon as possible. We will try to obtain results of recent bone density study. Further recommendations to follow. Office visit in 3 months.

## 2020-05-04 NOTE — Patient Instructions (Signed)
Lipid request bone density report from [place]. Physician will call the results of abdominal pelvic CT pain completed. Try to take prednisone no more than 5 times a week.

## 2020-05-08 ENCOUNTER — Other Ambulatory Visit: Payer: Self-pay

## 2020-05-08 ENCOUNTER — Ambulatory Visit (HOSPITAL_COMMUNITY)
Admission: RE | Admit: 2020-05-08 | Discharge: 2020-05-08 | Disposition: A | Discharge status: Home / Self Care | Payer: Medicare Other | Source: Ambulatory Visit | Attending: Internal Medicine | Admitting: Internal Medicine

## 2020-05-08 DIAGNOSIS — K5080 Crohn's disease of both small and large intestine without complications: Secondary | ICD-10-CM | POA: Insufficient documentation

## 2020-05-08 MED ORDER — IOHEXOL 300 MG/ML  SOLN
100.0000 mL | Freq: Once | INTRAMUSCULAR | Status: AC | PRN
  Administered 2020-05-08: 100 mL via INTRAVENOUS

## 2020-08-03 ENCOUNTER — Telehealth (INDEPENDENT_AMBULATORY_CARE_PROVIDER_SITE_OTHER): Payer: Self-pay | Admitting: Internal Medicine

## 2020-08-03 NOTE — Telephone Encounter (Signed)
Yes - can you verify if she is taking prednisone 59m 5x/week as per Dr. ROtelia Limeslast note? If so I can send refill.    If her insurance allows [facility] can [facility] sometimes insurances require referrals to come from PCP. Thanks.

## 2020-08-03 NOTE — Telephone Encounter (Signed)
Patient called wanted to know if she needs a referral to a rheumatologist - stated she is having a hard time getting an appointment - stated she needs a refill on her prednizone - has only 2 pills left   -  Please advise - [phone]

## 2020-08-03 NOTE — Telephone Encounter (Signed)
Talked with the patient. She states that Dr.Rehman told her she could take 35 mg weekly. I suggested that she call the referring doctor and ask that he call the Rheumatologist to maybe call and speak him or her to see if she may be seen prior to January2022.

## 2020-08-03 NOTE — Telephone Encounter (Signed)
Thayer Headings may I ask that you review this and see if you can refill patient's prednisone? Patient will need to be called and see if we make referral to [facility] , she will have the transportation from [place] to [employer]. Patient will be called and ask as soon as we know about the possible refill on Prednisone.

## 2020-08-04 MED ORDER — PREDNISONE 5 MG PO TABS: 5.0000 mg | ORAL_TABLET | Freq: Every day | ORAL | 1 refills | Status: AC

## 2020-08-04 NOTE — Telephone Encounter (Signed)
Please notify patient I sent a refill to her pharmacy.  I do agree with the rheumatology referral as well sooner than January if able.

## 2020-08-04 NOTE — Telephone Encounter (Signed)
Patient called and made aware.

## 2020-08-07 ENCOUNTER — Ambulatory Visit (INDEPENDENT_AMBULATORY_CARE_PROVIDER_SITE_OTHER): Payer: Medicare Other | Admitting: Gastroenterology

## 2020-08-17 ENCOUNTER — Encounter (INDEPENDENT_AMBULATORY_CARE_PROVIDER_SITE_OTHER): Payer: Self-pay | Admitting: *Deleted

## 2020-08-17 ENCOUNTER — Other Ambulatory Visit (INDEPENDENT_AMBULATORY_CARE_PROVIDER_SITE_OTHER): Payer: Self-pay | Admitting: *Deleted

## 2020-08-17 ENCOUNTER — Telehealth (INDEPENDENT_AMBULATORY_CARE_PROVIDER_SITE_OTHER): Payer: Self-pay | Admitting: *Deleted

## 2020-08-17 DIAGNOSIS — K5010 Crohn's disease of large intestine without complications: Secondary | ICD-10-CM

## 2020-08-17 MED ORDER — PEG 3350-KCL-NA BICARB-NACL 420 G PO SOLR: 4000.0000 mL | Freq: Once | ORAL | 0 refills | Status: AC

## 2020-08-17 NOTE — Telephone Encounter (Signed)
Patient needs trilyte 

## 2020-08-31 ENCOUNTER — Ambulatory Visit (INDEPENDENT_AMBULATORY_CARE_PROVIDER_SITE_OTHER): Payer: Medicare Other | Admitting: Gastroenterology

## 2020-10-02 ENCOUNTER — Other Ambulatory Visit (HOSPITAL_COMMUNITY): Admission: RE | Admit: 2020-10-02 | Payer: Medicare Other | Source: Ambulatory Visit

## 2020-10-02 ENCOUNTER — Encounter (HOSPITAL_COMMUNITY)
Admission: RE | Admit: 2020-10-02 | Discharge: 2020-10-02 | Disposition: A | Discharge status: Home / Self Care | Payer: Medicare Other | Source: Ambulatory Visit | Attending: Internal Medicine | Admitting: Internal Medicine

## 2020-10-03 ENCOUNTER — Telehealth (INDEPENDENT_AMBULATORY_CARE_PROVIDER_SITE_OTHER): Payer: Self-pay | Admitting: *Deleted

## 2020-10-03 NOTE — Telephone Encounter (Signed)
Kiara Welch called and cancelled her procedure for this Wednesday. She doesn't have transportation.

## 2020-10-04 ENCOUNTER — Ambulatory Visit (HOSPITAL_COMMUNITY): Admit: 2020-10-04 | Payer: Medicare Other | Admitting: Internal Medicine

## 2020-10-04 ENCOUNTER — Encounter (HOSPITAL_COMMUNITY): Payer: Self-pay

## 2020-10-04 SURGERY — COLONOSCOPY WITH PROPOFOL
Anesthesia: Monitor Anesthesia Care

## 2020-10-04 NOTE — Telephone Encounter (Signed)
Noted  

## 2021-01-16 ENCOUNTER — Other Ambulatory Visit (HOSPITAL_COMMUNITY): Payer: Self-pay | Admitting: Interventional Radiology

## 2021-01-16 ENCOUNTER — Telehealth (HOSPITAL_COMMUNITY): Payer: Self-pay

## 2021-01-16 DIAGNOSIS — I871 Compression of vein: Secondary | ICD-10-CM

## 2021-01-16 NOTE — Telephone Encounter (Signed)
-----   Message from Aletta Edouard, MD sent at 01/16/2021 12:15 PM EST ----- Regarding: RE: Venogram Svc No; regular conscious sedation, usually about an hour case.  GY   ----- Message ----- From: Danielle Dess Sent: 01/16/2021  11:42 AM EST To: Aletta Edouard, MD Subject: RE: Venogram Svc                               She is having facial symptoms. I've put her on the schedule for next Friday. Do you want anything special for that case so I can let the techs know?  Thanks,  Lia Foyer ----- Message ----- From: Aletta Edouard, MD Sent: 01/16/2021  10:31 AM EST To: Danielle Dess Subject: RE: Venogram Svc                               If it's only in arm and hand, she needs a right upper extremity venous ultrasound to rule out DVT (can be done at [place], [facility]). 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  GY   ----- Message ----- From: Danielle Dess Sent: 01/16/2021  10:17 AM EST To: Aletta Edouard, MD Subject: [name],   [name] called this morning with complaints of right arm and hand edema. PCP wants her to get back in to see you for a venogram. You are here next Friday which works great for her but I just wanted to make sure it was ok to put her on the schedule.   Thanks,  Lia Foyer

## 2021-01-22 ENCOUNTER — Telehealth (HOSPITAL_COMMUNITY): Payer: Self-pay

## 2021-01-22 NOTE — Telephone Encounter (Signed)
Pt called to cancel procedure with Dr. Kathlene Cote. She is having transportation issues. She will call back to reschedule. AW

## 2021-01-23 ENCOUNTER — Encounter (INDEPENDENT_AMBULATORY_CARE_PROVIDER_SITE_OTHER): Payer: Self-pay

## 2021-01-26 ENCOUNTER — Ambulatory Visit (HOSPITAL_COMMUNITY): Payer: Medicare Other

## 2021-05-17 ENCOUNTER — Ambulatory Visit (INDEPENDENT_AMBULATORY_CARE_PROVIDER_SITE_OTHER): Payer: Medicare Other | Admitting: Gastroenterology

## 2021-08-02 ENCOUNTER — Telehealth (INDEPENDENT_AMBULATORY_CARE_PROVIDER_SITE_OTHER): Payer: Self-pay | Admitting: *Deleted

## 2021-08-02 NOTE — Telephone Encounter (Signed)
Pt needs OV with Dr. Laural Golden for follow up. Per Dr Laural Golden make appt with him.

## 2021-10-18 ENCOUNTER — Ambulatory Visit (INDEPENDENT_AMBULATORY_CARE_PROVIDER_SITE_OTHER): Payer: Medicare Other | Admitting: Gastroenterology

## 2021-10-29 ENCOUNTER — Ambulatory Visit (INDEPENDENT_AMBULATORY_CARE_PROVIDER_SITE_OTHER): Payer: Medicare Other | Admitting: Gastroenterology

## 2022-01-11 ENCOUNTER — Other Ambulatory Visit: Payer: Self-pay | Admitting: Interventional Radiology

## 2022-01-11 DIAGNOSIS — I871 Compression of vein: Secondary | ICD-10-CM

## 2022-06-24 ENCOUNTER — Telehealth (INDEPENDENT_AMBULATORY_CARE_PROVIDER_SITE_OTHER): Payer: Self-pay | Admitting: *Deleted

## 2022-06-24 NOTE — Telephone Encounter (Signed)
Forms filled, patient needs office visit for further refills

## 2022-06-24 NOTE — Telephone Encounter (Signed)
Forms faxed in for colostomy supplies. Form on Dr. Colman Cater desk for review. Patient last seen for Crohn's on 05/04/20. Needs appt since not seen in over 2 years. Mitzie Please schedule an appt.

## 2022-06-26 NOTE — Telephone Encounter (Signed)
Mitzie please schedule appt. Needs appt before further refills. Last seen may 2021. thanks

## 2022-06-26 NOTE — Telephone Encounter (Signed)
Forms were faxed back

## 2022-07-08 ENCOUNTER — Ambulatory Visit (INDEPENDENT_AMBULATORY_CARE_PROVIDER_SITE_OTHER): Payer: Medicare Other | Admitting: Gastroenterology

## 2023-05-26 ENCOUNTER — Telehealth (INDEPENDENT_AMBULATORY_CARE_PROVIDER_SITE_OTHER): Payer: Self-pay | Admitting: *Deleted

## 2023-05-26 NOTE — Telephone Encounter (Signed)
Discussed with patient that she needs ov before receiving refills. Last seen may 2021. She told me she had enough supplies til her visit in July. She could not make July 11th. Appt. States she needs a Monday afternoon. Mitzie can you reschedule?

## 2023-05-26 NOTE — Telephone Encounter (Signed)
Received fax fo physician order for ostomy supplies. Pt last seen by Dr. Karilyn Cota May 2021. These orders were filled out last July by Dr. Levon Hedger with a note she needs OV before further refills. Tried to call patient and no answer.   Mitzie please schedule office visit

## 2023-07-03 ENCOUNTER — Ambulatory Visit (INDEPENDENT_AMBULATORY_CARE_PROVIDER_SITE_OTHER): Payer: Medicare Other | Admitting: Gastroenterology

## 2023-08-04 ENCOUNTER — Ambulatory Visit (INDEPENDENT_AMBULATORY_CARE_PROVIDER_SITE_OTHER): Payer: Medicare Other | Admitting: Gastroenterology

## 2023-08-11 ENCOUNTER — Ambulatory Visit (INDEPENDENT_AMBULATORY_CARE_PROVIDER_SITE_OTHER): Payer: Medicare Other | Admitting: Gastroenterology

## 2023-08-27 ENCOUNTER — Ambulatory Visit (INDEPENDENT_AMBULATORY_CARE_PROVIDER_SITE_OTHER): Payer: Medicare Other | Admitting: Gastroenterology

## 2023-09-25 ENCOUNTER — Encounter: Payer: Self-pay | Admitting: *Deleted
